# Patient Record
Sex: Female | Born: 1950 | Race: Black or African American | Hispanic: No | Marital: Married | Smoking: Current every day smoker
Health system: Southern US, Community
[De-identification: ages and names within clinical notes are randomized; demographics above are authoritative.]

## PROBLEM LIST (undated history)

## (undated) DIAGNOSIS — J449 Chronic obstructive pulmonary disease, unspecified: Secondary | ICD-10-CM

## (undated) DIAGNOSIS — Z789 Other specified health status: Secondary | ICD-10-CM

## (undated) HISTORY — DX: Other specified health status: Z78.9

## (undated) HISTORY — PX: OTHER SURGICAL HISTORY: SHX169

---

## 2004-08-09 ENCOUNTER — Ambulatory Visit: Payer: Self-pay

## 2005-05-24 ENCOUNTER — Emergency Department: Payer: Self-pay | Admitting: Emergency Medicine

## 2005-07-31 ENCOUNTER — Emergency Department: Payer: Self-pay | Admitting: Emergency Medicine

## 2005-09-05 ENCOUNTER — Ambulatory Visit: Payer: Self-pay

## 2006-01-13 ENCOUNTER — Emergency Department: Payer: Self-pay | Admitting: Emergency Medicine

## 2006-09-04 ENCOUNTER — Emergency Department: Payer: Self-pay

## 2007-02-05 ENCOUNTER — Ambulatory Visit: Payer: Self-pay

## 2008-03-12 ENCOUNTER — Emergency Department: Payer: Self-pay | Admitting: Emergency Medicine

## 2008-04-21 ENCOUNTER — Ambulatory Visit: Payer: Self-pay

## 2008-09-10 ENCOUNTER — Emergency Department: Payer: Self-pay | Admitting: Unknown Physician Specialty

## 2008-12-17 ENCOUNTER — Emergency Department: Payer: Self-pay | Admitting: Internal Medicine

## 2009-10-12 ENCOUNTER — Ambulatory Visit: Payer: Self-pay

## 2011-11-14 ENCOUNTER — Ambulatory Visit: Payer: Self-pay

## 2012-10-06 ENCOUNTER — Emergency Department: Payer: Self-pay | Admitting: Emergency Medicine

## 2012-10-06 LAB — CBC
MCH: 33 pg (ref 26.0–34.0)
MCV: 95 fL (ref 80–100)
Platelet: 182 10*3/uL (ref 150–440)
RBC: 3.72 10*6/uL — ABNORMAL LOW (ref 3.80–5.20)
RDW: 13 % (ref 11.5–14.5)
WBC: 9.4 10*3/uL (ref 3.6–11.0)

## 2012-10-06 LAB — COMPREHENSIVE METABOLIC PANEL
Albumin: 3.3 g/dL — ABNORMAL LOW (ref 3.4–5.0)
Anion Gap: 8 (ref 7–16)
BUN: 8 mg/dL (ref 7–18)
Bilirubin,Total: 0.7 mg/dL (ref 0.2–1.0)
Chloride: 101 mmol/L (ref 98–107)
Co2: 27 mmol/L (ref 21–32)
Creatinine: 0.75 mg/dL (ref 0.60–1.30)
EGFR (African American): >60
EGFR (Non-African Amer.): >60
Osmolality: 270 (ref 275–301)
Potassium: 3.1 mmol/L — ABNORMAL LOW (ref 3.5–5.1)
Sodium: 136 mmol/L (ref 136–145)
Total Protein: 7 g/dL (ref 6.4–8.2)

## 2012-10-06 LAB — LIPASE, BLOOD: Lipase: 89 U/L (ref 73–393)

## 2012-10-06 LAB — URINALYSIS, COMPLETE
Glucose,UR: NEGATIVE mg/dL (ref 0–75)
Nitrite: NEGATIVE
Protein: 100
Specific Gravity: 1.025 (ref 1.003–1.030)

## 2012-10-06 LAB — RAPID INFLUENZA A&B ANTIGENS

## 2012-11-19 ENCOUNTER — Ambulatory Visit: Payer: Self-pay

## 2013-02-16 ENCOUNTER — Emergency Department: Payer: Self-pay | Admitting: Emergency Medicine

## 2013-05-15 ENCOUNTER — Emergency Department: Payer: Self-pay | Admitting: Internal Medicine

## 2013-06-30 ENCOUNTER — Emergency Department: Payer: Self-pay | Admitting: Internal Medicine

## 2013-11-25 ENCOUNTER — Ambulatory Visit: Payer: Self-pay

## 2013-12-27 ENCOUNTER — Emergency Department: Payer: Self-pay | Admitting: Emergency Medicine

## 2014-02-10 ENCOUNTER — Emergency Department: Payer: Self-pay | Admitting: Emergency Medicine

## 2014-04-20 ENCOUNTER — Emergency Department: Payer: Self-pay | Admitting: Emergency Medicine

## 2015-05-27 ENCOUNTER — Emergency Department
Admission: EM | Admit: 2015-05-27 | Discharge: 2015-05-27 | Disposition: A | Discharge status: Home / Self Care | Payer: Self-pay | Attending: Emergency Medicine | Admitting: Emergency Medicine

## 2015-05-27 ENCOUNTER — Emergency Department: Payer: Self-pay

## 2015-05-27 DIAGNOSIS — S4991XA Unspecified injury of right shoulder and upper arm, initial encounter: Secondary | ICD-10-CM | POA: Insufficient documentation

## 2015-05-27 DIAGNOSIS — M25511 Pain in right shoulder: Secondary | ICD-10-CM

## 2015-05-27 DIAGNOSIS — W1830XA Fall on same level, unspecified, initial encounter: Secondary | ICD-10-CM | POA: Insufficient documentation

## 2015-05-27 DIAGNOSIS — Y9289 Other specified places as the place of occurrence of the external cause: Secondary | ICD-10-CM | POA: Insufficient documentation

## 2015-05-27 DIAGNOSIS — Y9389 Activity, other specified: Secondary | ICD-10-CM | POA: Insufficient documentation

## 2015-05-27 DIAGNOSIS — Z720 Tobacco use: Secondary | ICD-10-CM | POA: Insufficient documentation

## 2015-05-27 DIAGNOSIS — S161XXA Strain of muscle, fascia and tendon at neck level, initial encounter: Secondary | ICD-10-CM | POA: Insufficient documentation

## 2015-05-27 DIAGNOSIS — Y998 Other external cause status: Secondary | ICD-10-CM | POA: Insufficient documentation

## 2015-05-27 MED ORDER — IBUPROFEN 600 MG PO TABS: 600.0000 mg | ORAL_TABLET | Freq: Four times a day (QID) | ORAL | Status: DC | PRN

## 2015-05-27 MED ORDER — CYCLOBENZAPRINE HCL 5 MG PO TABS: 5.0000 mg | ORAL_TABLET | Freq: Three times a day (TID) | ORAL | Status: DC | PRN

## 2015-05-27 NOTE — ED Provider Notes (Signed)
[facility] Emergency Department Provider Note  ____________________________________________  Time seen: Approximately 11:01 AM  I have reviewed the triage vital signs and the nursing notes.   HISTORY  Chief Complaint Fall    HPI Kimberly Berger is a 64 y.o. female who presents with complaints of neck and shoulder pain. Patient states that she fell on Monday . Complains of just muscle aches and pains in her shoulder or neck. Concerned about arthritis.   History reviewed. No pertinent past medical history.  There are no active problems to display for this patient.   History reviewed. No pertinent past surgical history.  Current Outpatient Rx  Name  Route  Sig  Dispense  Refill  . cyclobenzaprine (FLEXERIL) 5 MG tablet   Oral   Take 1 tablet (5 mg total) by mouth every 8 (eight) hours as needed for muscle spasms.   30 tablet   0   . ibuprofen (ADVIL,MOTRIN) 600 MG tablet   Oral   Take 1 tablet (600 mg total) by mouth every 6 (six) hours as needed.   30 tablet   0     Allergies Review of patient's allergies indicates no known allergies.  No family history on file.  Social History Social History  Substance Use Topics  . Smoking status: Current Every Day Smoker    Types: Cigarettes  . Smokeless tobacco: None  . Alcohol Use: No    Review of Systems Constitutional: No fever/chills Eyes: No visual changes. ENT: No sore throat. Cardiovascular: Denies chest pain. Respiratory: Denies shortness of breath. Gastrointestinal: No abdominal pain.  No nausea, no vomiting.  No diarrhea.  No constipation. Genitourinary: Negative for dysuria. Musculoskeletal: Positive for neck shoulder and back pain.  Skin: Negative for rash. Neurological: Negative for headaches, focal weakness or numbness.  10-point ROS otherwise negative.  ____________________________________________   PHYSICAL EXAM:  VITAL SIGNS: ED Triage Vitals  Enc Vitals Group   BP 05/27/15 1021 151/68 mmHg     Pulse Rate 05/27/15 1021 78     Resp 05/27/15 1021 18     Temp 05/27/15 1021 98.2 F (36.8 C)     Temp Source 05/27/15 1021 Oral     SpO2 05/27/15 1021 98 %     Weight 05/27/15 1021 118 lb (53.524 kg)     Height 05/27/15 1021  (1.626 m)     Head Cir --      Peak Flow --      Pain Score 05/27/15 1022 8     Pain Loc --      Pain Edu? --      Excl. in GC? --     Constitutional: Alert and oriented. Well appearing and in no acute distress. Head: Atraumatic. Neck: No stridor.   Cardiovascular: Normal rate, regular rhythm. Grossly normal heart sounds.  Good peripheral circulation. Respiratory: Normal respiratory effort.  No retractions. Lungs CTAB. Musculoskeletal: No lower extremity tenderness nor edema.  No joint effusions. Full range of motion of the neck with point tenderness around C7. Neurologic:  Normal speech and language. No gross focal neurologic deficits are appreciated. No gait instability. Skin:  Skin is warm, dry and intact. No rash noted. Psychiatric: Mood and affect are normal. Speech and behavior are normal.  ____________________________________________   LABS (all labs ordered are listed, but only abnormal results are displayed)  Labs Reviewed - No data to display ____________________________________________  RADIOLOGY  Cervical and right shoulder x-rays interpreted by radiologist and reviewed by myself. Negative  for fracture dislocation. ____________________________________________   PROCEDURES  Procedure(s) performed: None  Critical Care performed: No  ____________________________________________   INITIAL IMPRESSION / ASSESSMENT AND PLAN / ED COURSE  Pertinent labs & imaging results that were available during my care of the patient were reviewed by me and considered in my medical decision making (see chart for details).  Acute cervical and shoulder strain. Rx given for Motrin 600 mg 3 times a day and Flexeril 5  mg 3 times a day. Patient follow-up with PCP as needed.  Patient voices no other emergency medical complaints at this time. ____________________________________________   FINAL CLINICAL IMPRESSION(S) / ED DIAGNOSES  Final diagnoses:  Neck strain, initial encounter  Shoulder pain, right      Evangeline Dakin, PA-C 05/27/15 1551  Sharyn Creamer, MD 05/27/15 1553

## 2015-05-27 NOTE — ED Notes (Signed)
Pt c/o right lateral rib pain and right shoulder pain, worse since fall on monday

## 2015-05-27 NOTE — Discharge Instructions (Signed)
Arthralgia °Your caregiver has diagnosed you as suffering from an arthralgia. Arthralgia means there is pain in a joint. This can come from many reasons including: °· Bruising the joint which causes soreness (inflammation) in the joint. °· Wear and tear on the joints which occur as we grow older (osteoarthritis). °· Overusing the joint. °· Various forms of arthritis. °· Infections of the joint. °Regardless of the cause of pain in your joint, most of these different pains respond to anti-inflammatory drugs and rest. The exception to this is when a joint is infected, and these cases are treated with antibiotics, if it is a bacterial infection. °HOME CARE INSTRUCTIONS  °· Rest the injured area for as long as directed by your caregiver. Then slowly start using the joint as directed by your caregiver and as the pain allows. Crutches as directed may be useful if the ankles, knees or hips are involved. If the knee was splinted or casted, continue use and care as directed. If an stretchy or elastic wrapping bandage has been applied today, it should be removed and re-applied every 3 to 4 hours. It should not be applied tightly, but firmly enough to keep swelling down. Watch toes and feet for swelling, bluish discoloration, coldness, numbness or excessive pain. If any of these problems (symptoms) occur, remove the ace bandage and re-apply more loosely. If these symptoms persist, contact your caregiver or return to this location. °· For the first 24 hours, keep the injured extremity elevated on pillows while lying down. °· Apply ice for 15-20 minutes to the sore joint every couple hours while awake for the first half day. Then 03-04 times per day for the first 48 hours. Put the ice in a plastic bag and place a towel between the bag of ice and your skin. °· Wear any splinting, casting, elastic bandage applications, or slings as instructed. °· Only take over-the-counter or prescription medicines for pain, discomfort, or fever as  directed by your caregiver. Do not use aspirin immediately after the injury unless instructed by your physician. Aspirin can cause increased bleeding and bruising of the tissues. °· If you were given crutches, continue to use them as instructed and do not resume weight bearing on the sore joint until instructed. °Persistent pain and inability to use the sore joint as directed for more than 2 to 3 days are warning signs indicating that you should see a caregiver for a follow-up visit as soon as possible. Initially, a hairline fracture (break in bone) may not be evident on X-rays. Persistent pain and swelling indicate that further evaluation, non-weight bearing or use of the joint (use of crutches or slings as instructed), or further X-rays are indicated. X-rays may sometimes not show a small fracture until a week or 10 days later. Make a follow-up appointment with your own caregiver or one to whom we have referred you. A radiologist (specialist in reading X-rays) may read your X-rays. Make sure you know how you are to obtain your X-ray results. Do not assume everything is normal if you do not hear from us. °SEEK MEDICAL CARE IF: °Bruising, swelling, or pain increases. °SEEK IMMEDIATE MEDICAL CARE IF:  °· Your fingers or toes are numb or blue. °· The pain is not responding to medications and continues to stay the same or get worse. °· The pain in your joint becomes severe. °· You develop a fever over 102° F (38.9° C). °· It becomes impossible to move or use the joint. °MAKE SURE YOU:  °·   Understand these instructions. °· Will watch your condition. °· Will get help right away if you are not doing well or get worse. °Document Released: 10/01/2005 Document Revised: 12/24/2011 Document Reviewed: 05/19/2008 °[employer]® Patient Information ©2015 [employer]. This information is not intended to replace advice given to you by your health care provider. Make sure you discuss any questions you have with your health care  provider. ° °Arthritis, Nonspecific °Arthritis is inflammation of a joint. This usually means pain, redness, warmth or swelling are present. One or more joints may be involved. There are a number of types of arthritis. Your caregiver may not be able to tell what type of arthritis you have right away. °CAUSES  °The most common cause of arthritis is the wear and tear on the joint (osteoarthritis). This causes damage to the cartilage, which can break down over time. The knees, hips, back and neck are most often affected by this type of arthritis. °Other types of arthritis and common causes of joint pain include: °· Sprains and other injuries near the joint. Sometimes minor sprains and injuries cause pain and swelling that develop hours later. °· Rheumatoid arthritis. This affects hands, feet and knees. It usually affects both sides of your body at the same time. It is often associated with chronic ailments, fever, weight loss and general weakness. °· Crystal arthritis. Gout and pseudo gout can cause occasional acute severe pain, redness and swelling in the foot, ankle, or knee. °· Infectious arthritis. Bacteria can get into a joint through a break in overlying skin. This can cause infection of the joint. Bacteria and viruses can also spread through the blood and affect your joints. °· Drug, infectious and allergy reactions. Sometimes joints can become mildly painful and slightly swollen with these types of illnesses. °SYMPTOMS  °· Pain is the main symptom. °· Your joint or joints can also be red, swollen and warm or hot to the touch. °· You may have a fever with certain types of arthritis, or even feel overall ill. °· The joint with arthritis will hurt with movement. Stiffness is present with some types of arthritis. °DIAGNOSIS  °Your caregiver will suspect arthritis based on your description of your symptoms and on your exam. Testing may be needed to find the type of arthritis: °· Blood and sometimes urine  tests. °· X-ray tests and sometimes CT or MRI scans. °· Removal of fluid from the joint (arthrocentesis) is done to check for bacteria, crystals or other causes. Your caregiver (or a specialist) will numb the area over the joint with a local anesthetic, and use a needle to remove joint fluid for examination. This procedure is only minimally uncomfortable. °· Even with these tests, your caregiver may not be able to tell what kind of arthritis you have. Consultation with a specialist (rheumatologist) may be helpful. °TREATMENT  °Your caregiver will discuss with you treatment specific to your type of arthritis. If the specific type cannot be determined, then the following general recommendations may apply. °Treatment of severe joint pain includes: °· Rest. °· Elevation. °· Anti-inflammatory medication (for example, ibuprofen) may be prescribed. Avoiding activities that cause increased pain. °· Only take over-the-counter or prescription medicines for pain and discomfort as recommended by your caregiver. °· Cold packs over an inflamed joint may be used for 10 to 15 minutes every hour. Hot packs sometimes feel better, but do not use overnight. Do not use hot packs if you are diabetic without your caregiver's permission. °· A cortisone shot into arthritic   joints may help reduce pain and swelling. °· Any acute arthritis that gets worse over the next 1 to 2 days needs to be looked at to be sure there is no joint infection. °Long-term arthritis treatment involves modifying activities and lifestyle to reduce joint stress jarring. This can include weight loss. Also, exercise is needed to nourish the joint cartilage and remove waste. This helps keep the muscles around the joint strong. °HOME CARE INSTRUCTIONS  °· Do not take aspirin to relieve pain if gout is suspected. This elevates uric acid levels. °· Only take over-the-counter or prescription medicines for pain, discomfort or fever as directed by your caregiver. °· Rest the  joint as much as possible. °· If your joint is swollen, keep it elevated. °· Use crutches if the painful joint is in your leg. °· Drinking plenty of fluids may help for certain types of arthritis. °· Follow your caregiver's dietary instructions. °· Try low-impact exercise such as: °¨ Swimming. °¨ Water aerobics. °¨ Biking. °¨ Walking. °· Morning stiffness is often relieved by a warm shower. °· Put your joints through regular range-of-motion. °SEEK MEDICAL CARE IF:  °· You do not feel better in 24 hours or are getting worse. °· You have side effects to medications, or are not getting better with treatment. °SEEK IMMEDIATE MEDICAL CARE IF:  °· You have a fever. °· You develop severe joint pain, swelling or redness. °· Many joints are involved and become painful and swollen. °· There is severe back pain and/or leg weakness. °· You have loss of bowel or bladder control. °Document Released: 11/08/2004 Document Revised: 12/24/2011 Document Reviewed: 11/24/2008 °[employer]® Patient Information ©2015 [employer]. This information is not intended to replace advice given to you by your health care provider. Make sure you discuss any questions you have with your health care provider. ° °

## 2015-06-28 ENCOUNTER — Emergency Department
Admission: EM | Admit: 2015-06-28 | Discharge: 2015-06-28 | Disposition: A | Discharge status: Home / Self Care | Payer: Self-pay | Attending: Emergency Medicine | Admitting: Emergency Medicine

## 2015-06-28 ENCOUNTER — Emergency Department: Payer: Self-pay

## 2015-06-28 ENCOUNTER — Encounter: Payer: Self-pay | Admitting: *Deleted

## 2015-06-28 DIAGNOSIS — T148 Other injury of unspecified body region: Secondary | ICD-10-CM | POA: Insufficient documentation

## 2015-06-28 DIAGNOSIS — Y9389 Activity, other specified: Secondary | ICD-10-CM | POA: Insufficient documentation

## 2015-06-28 DIAGNOSIS — Z720 Tobacco use: Secondary | ICD-10-CM | POA: Insufficient documentation

## 2015-06-28 DIAGNOSIS — S79911A Unspecified injury of right hip, initial encounter: Secondary | ICD-10-CM | POA: Insufficient documentation

## 2015-06-28 DIAGNOSIS — Y9289 Other specified places as the place of occurrence of the external cause: Secondary | ICD-10-CM | POA: Insufficient documentation

## 2015-06-28 DIAGNOSIS — M25551 Pain in right hip: Secondary | ICD-10-CM

## 2015-06-28 DIAGNOSIS — W19XXXA Unspecified fall, initial encounter: Secondary | ICD-10-CM

## 2015-06-28 DIAGNOSIS — S3991XA Unspecified injury of abdomen, initial encounter: Secondary | ICD-10-CM | POA: Insufficient documentation

## 2015-06-28 DIAGNOSIS — W1839XA Other fall on same level, initial encounter: Secondary | ICD-10-CM | POA: Insufficient documentation

## 2015-06-28 DIAGNOSIS — Y998 Other external cause status: Secondary | ICD-10-CM | POA: Insufficient documentation

## 2015-06-28 MED ORDER — OXYCODONE-ACETAMINOPHEN 5-325 MG PO TABS: 1.0000 | ORAL_TABLET | Freq: Four times a day (QID) | ORAL | Status: DC | PRN

## 2015-06-28 NOTE — ED Notes (Signed)
Pt ambulatory to triage room with steady gait. Pt reports last Wednesday she was mowing her grass and went over a statue in her yard, causing her to fall back. C/o right hip pain, has been taking ibuprofen with some relief.

## 2015-06-28 NOTE — ED Provider Notes (Signed)
[facility] Emergency Department Provider Note     Time seen: ----------------------------------------- 5:35 PM on 06/28/2015 -----------------------------------------    I have reviewed the triage vital signs and the nursing notes.   HISTORY  Chief Complaint Fall    HPI Kimberly Berger is a 64 y.o. female who presents to ER with right hip pain. Patient states last Wednesday she was mowing her grass and went over a statue in her yard. This caused her to fall and injure her right hip. She's been taking ibuprofen with some relief, denies any other complaints.   History reviewed. No pertinent past medical history.  There are no active problems to display for this patient.   History reviewed. No pertinent past surgical history.  Allergies Review of patient's allergies indicates no known allergies.  Social History Social History  Substance Use Topics  . Smoking status: Current Every Day Smoker    Types: Cigarettes  . Smokeless tobacco: None  . Alcohol Use: No    Review of Systems Constitutional: Negative for fever. Gastrointestinal: Positive for abdominal pain Genitourinary: Negative for dysuria. Musculoskeletal: Positive for right hip pain Skin: Negative for rash. ____________________________________________   PHYSICAL EXAM:  VITAL SIGNS: ED Triage Vitals  Enc Vitals Group     BP 06/28/15 1708 132/74 mmHg     Pulse Rate 06/28/15 1708 77     Resp 06/28/15 1708 18     Temp 06/28/15 1708 98.4 F (36.9 C)     Temp Source 06/28/15 1708 Oral     SpO2 06/28/15 1708 98 %     Weight 06/28/15 1708 118 lb (53.524 kg)     Height --      Head Cir --      Peak Flow --      Pain Score 06/28/15 1708 5     Pain Loc --      Pain Edu? --      Excl. in GC? --     Constitutional: Alert and oriented. Well appearing and in no distress. Gastrointestinal: Mild epigastric tenderness Musculoskeletal: Pain with range of motion right hip Neurologic:   Normal speech and language. No gross focal neurologic deficits are appreciated. Speech is normal. No gait instability. Skin:  Skin is warm, dry and intact. No rash noted.  ____________________________________________  ED COURSE:  Pertinent labs & imaging results that were available during my care of the patient were reviewed by me and considered in my medical decision making (see chart for details). Patient is no acute distress, will obtain right hip x-rays ____________________________________________  RADIOLOGY Images were viewed by me  Right hip x-rays  IMPRESSION: Negative.Consider MRI of right hip if there is reasonable consideration of radiographically occult fracture. ____________________________________________  FINAL ASSESSMENT AND PLAN  Fall, contusion  Plan: Patient with labs and imaging as dictated above. Patient is stable for discharge with pain medication and close follow-up with her doctor.   Emily Filbert, MD   Emily Filbert, MD 06/28/15 [phone]

## 2015-06-28 NOTE — Discharge Instructions (Signed)
Fall Prevention and Home Safety Falls cause injuries and can affect all age groups. It is possible to prevent falls.  HOW TO PREVENT FALLS  Wear shoes with rubber soles that do not have an opening for your toes.  Keep the inside and outside of your house well lit.  Use night lights throughout your home.  Remove clutter from floors.  Clean up floor spills.  Remove throw rugs or fasten them to the floor with carpet tape.  Do not place electrical cords across pathways.  Put grab bars by your tub, shower, and toilet. Do not use towel bars as grab bars.  Put handrails on both sides of the stairway. Fix loose handrails.  Do not climb on stools or stepladders, if possible.  Do not wax your floors.  Repair uneven or unsafe sidewalks, walkways, or stairs.  Keep items you use a lot within reach.  Be aware of pets.  Keep emergency numbers next to the telephone.  Put smoke detectors in your home and near bedrooms. Ask your doctor what other things you can do to prevent falls. Document Released: 07/28/2009 Document Revised: 04/01/2012 Document Reviewed: 01/01/2012 [facility] Patient Information 2015 [place], [place]. This information is not intended to replace advice given to you by your health care provider. Make sure you discuss any questions you have with your health care provider.  Hip Pain Your hip is the joint between your upper legs and your lower pelvis. The bones, cartilage, tendons, and muscles of your hip joint perform a lot of work each day supporting your body weight and allowing you to move around. Hip pain can range from a minor ache to severe pain in one or both of your hips. Pain may be felt on the inside of the hip joint near the groin, or the outside near the buttocks and upper thigh. You may have swelling or stiffness as well.  HOME CARE INSTRUCTIONS   Take medicines only as directed by your health care provider.  Apply ice to the injured area:  Put ice in a  plastic bag.  Place a towel between your skin and the bag.  Leave the ice on for 15-20 minutes at a time, 3-4 times a day.  Keep your leg raised (elevated) when possible to lessen swelling.  Avoid activities that cause pain.  Follow specific exercises as directed by your health care provider.  Sleep with a pillow between your legs on your most comfortable side.  Record how often you have hip pain, the location of the pain, and what it feels like. SEEK MEDICAL CARE IF:   You are unable to put weight on your leg.  Your hip is red or swollen or very tender to touch.  Your pain or swelling continues or worsens after 1 week.  You have increasing difficulty walking.  You have a fever. SEEK IMMEDIATE MEDICAL CARE IF:   You have fallen.  You have a sudden increase in pain and swelling in your hip. MAKE SURE YOU:   Understand these instructions.  Will watch your condition.  Will get help right away if you are not doing well or get worse. Document Released: 03/21/2010 Document Revised: 02/15/2014 Document Reviewed: 05/28/2013 [facility] Patient Information 2015 [place], [place]. This information is not intended to replace advice given to you by your health care provider. Make sure you discuss any questions you have with your health care provider.

## 2015-08-04 ENCOUNTER — Emergency Department: Payer: Self-pay

## 2015-08-04 ENCOUNTER — Encounter: Payer: Self-pay | Admitting: Emergency Medicine

## 2015-08-04 ENCOUNTER — Emergency Department
Admission: EM | Admit: 2015-08-04 | Discharge: 2015-08-04 | Disposition: A | Discharge status: Home / Self Care | Payer: Self-pay | Attending: Emergency Medicine | Admitting: Emergency Medicine

## 2015-08-04 DIAGNOSIS — M545 Low back pain: Secondary | ICD-10-CM | POA: Insufficient documentation

## 2015-08-04 DIAGNOSIS — Z720 Tobacco use: Secondary | ICD-10-CM | POA: Insufficient documentation

## 2015-08-04 DIAGNOSIS — J189 Pneumonia, unspecified organism: Secondary | ICD-10-CM

## 2015-08-04 DIAGNOSIS — J159 Unspecified bacterial pneumonia: Secondary | ICD-10-CM | POA: Insufficient documentation

## 2015-08-04 DIAGNOSIS — M542 Cervicalgia: Secondary | ICD-10-CM | POA: Insufficient documentation

## 2015-08-04 DIAGNOSIS — M7918 Myalgia, other site: Secondary | ICD-10-CM

## 2015-08-04 DIAGNOSIS — R42 Dizziness and giddiness: Secondary | ICD-10-CM | POA: Insufficient documentation

## 2015-08-04 MED ORDER — ALBUTEROL SULFATE HFA 108 (90 BASE) MCG/ACT IN AERS
2.0000 | INHALATION_SPRAY | Freq: Once | RESPIRATORY_TRACT | Status: AC
  Administered 2015-08-04: 2 via RESPIRATORY_TRACT
  Filled 2015-08-04: qty 6.7

## 2015-08-04 MED ORDER — AZITHROMYCIN 250 MG PO TABS: 500.0000 mg | ORAL_TABLET | Freq: Every day | ORAL | Status: AC

## 2015-08-04 MED ORDER — PREDNISONE 20 MG PO TABS: 60.0000 mg | ORAL_TABLET | Freq: Once | ORAL | Status: DC

## 2015-08-04 MED ORDER — IBUPROFEN 600 MG PO TABS: 600.0000 mg | ORAL_TABLET | Freq: Three times a day (TID) | ORAL | Status: DC | PRN

## 2015-08-04 MED ORDER — ALBUTEROL SULFATE HFA 108 (90 BASE) MCG/ACT IN AERS
2.0000 | INHALATION_SPRAY | RESPIRATORY_TRACT | Status: DC | PRN
Start: 2015-08-04 — End: 2015-12-01

## 2015-08-04 MED ORDER — IBUPROFEN 600 MG PO TABS
600.0000 mg | ORAL_TABLET | Freq: Once | ORAL | Status: AC
  Administered 2015-08-04: 600 mg via ORAL
  Filled 2015-08-04: qty 1

## 2015-08-04 MED ORDER — ALBUTEROL SULFATE HFA 108 (90 BASE) MCG/ACT IN AERS: 2.0000 | INHALATION_SPRAY | Freq: Once | RESPIRATORY_TRACT | Status: DC

## 2015-08-04 MED ORDER — AZITHROMYCIN 250 MG PO TABS
500.0000 mg | ORAL_TABLET | Freq: Once | ORAL | Status: AC
  Administered 2015-08-04: 500 mg via ORAL
  Filled 2015-08-04: qty 2

## 2015-08-04 MED ORDER — PREDNISONE 20 MG PO TABS
60.0000 mg | ORAL_TABLET | Freq: Once | ORAL | Status: AC
  Administered 2015-08-04: 60 mg via ORAL
  Filled 2015-08-04: qty 3

## 2015-08-04 MED ORDER — OPTICHAMBER DIAMOND MISC
1.0000 | Freq: Once | Status: AC
  Administered 2015-08-04: 1
  Filled 2015-08-04: qty 1

## 2015-08-04 NOTE — ED Provider Notes (Signed)
[facility] Emergency Department Provider Note  ____________________________________________  Time seen: Approximately 12:26 PM  I have reviewed the triage vital signs and the nursing notes.   HISTORY  Chief Complaint Generalized Body Aches and Cough    HPI Kimberly Berger is a 64 y.o. female with ongoing tobacco abuse presenting with 3 days of cough productive of green sputum. She also reports subjective fevers and chills. She has nausea only at night. No chest pain, shortness of breath, dyspnea on exertion. No lower extremity swelling. She is also reporting of "crick" in her left neck and soreness over her right buttock. No history of trauma. No numbness, weakness, or tingling.   History reviewed. No pertinent past medical history.  There are no active problems to display for this patient.   History reviewed. No pertinent past surgical history.  Current Outpatient Rx  Name  Route  Sig  Dispense  Refill  . albuterol (PROVENTIL HFA;VENTOLIN HFA) 108 (90 BASE) MCG/ACT inhaler   Inhalation   Inhale 2 puffs into the lungs every 4 (four) hours as needed for wheezing or shortness of breath.   1 Inhaler   0   . azithromycin (ZITHROMAX) 250 MG tablet   Oral   Take 2 tablets (500 mg total) by mouth daily. Take 1 tablet daily for 3 days.   4 tablet   0   . cyclobenzaprine (FLEXERIL) 5 MG tablet   Oral   Take 1 tablet (5 mg total) by mouth every 8 (eight) hours as needed for muscle spasms.   30 tablet   0   . ibuprofen (ADVIL,MOTRIN) 600 MG tablet   Oral   Take 1 tablet (600 mg total) by mouth every 8 (eight) hours as needed for moderate pain (with food).   15 tablet   0   . oxyCODONE-acetaminophen (ROXICET) 5-325 MG per tablet   Oral   Take 1 tablet by mouth every 6 (six) hours as needed.   20 tablet   0   . predniSONE (DELTASONE) 20 MG tablet   Oral   Take 3 tablets (60 mg total) by mouth once.   15 tablet   0     Allergies Review of  patient's allergies indicates no known allergies.  History reviewed. No pertinent family history.  Social History Social History  Substance Use Topics  . Smoking status: Current Every Day Smoker    Types: Cigarettes  . Smokeless tobacco: None  . Alcohol Use: No    Review of Systems  Constitutional: Subjective fever/chills area and no syncope. Lightheadedness Eyes: No visual changes.  ENT: No sore throat. Cardiovascular: Denies chest pain, palpitations. Respiratory: Denies shortness of breath.  As needed for productive cough. Gastrointestinal: No abdominal pain.  Positive nausea, no vomiting.  No diarrhea.  No constipation. Genitourinary: Negative for dysuria. Musculoskeletal: Negative for back pain. Skin: Negative for rash. Neurological: Negative for headaches, focal weakness or numbness. 10-point ROS otherwise negative.  ____________________________________________   PHYSICAL EXAM:  VITAL SIGNS: ED Triage Vitals  Enc Vitals Group     BP 08/04/15 1107 104/74 mmHg     Pulse Rate 08/04/15 1107 97     Resp 08/04/15 1107 20     Temp 08/04/15 1107 98.2 F (36.8 C)     Temp Source 08/04/15 1107 Oral     SpO2 08/04/15 1107 97 %     Weight 08/04/15 1107 118 lb (53.524 kg)     Height 08/04/15 1107 5\' 4"  (1.626 m)  Head Cir --      Peak Flow --      Pain Score 08/04/15 1108 6     Pain Loc --      Pain Edu? --      Excl. in GC? --     Constitutional: Alert and oriented. Well appearing and in no acute distress. Answers question appropriately. Eyes: Conjunctivae are normal.  EOMI. no scleral icterus. Head: Atraumatic. Nose: No congestion/rhinnorhea. Mouth/Throat: Mucous membranes are moist. No posterior pharyngeal erythema, no tonsillar swelling or exudate. Palate is symmetric and uvula is midline. Neck: No stridor.  Supple.  Full range of motion with minimal pain when she looks laterally left. No midline C-spine tenderness, no step-offs or  deformities. Cardiovascular: Normal rate, regular rhythm. No murmurs, rubs or gallops.  Respiratory: Normal respiratory effort.  No retractions. Lungs CTAB.  No wheezes, ronchi. Minimal Rales in the left base which clear with deep cough.. Gastrointestinal: Soft and nontender. No distention. No peritoneal signs. PELVIS: Pelvis is stable with full range of motion of the bilateral knees and hips without pain.  Musculoskeletal: No LE edema. No midline lumbar spine tenderness to palpation. Neurologic:  Normal speech and language. 5 out of 5 bilateral grip biceps triceps hip flexors, dorsiflexion and plantarflexion. Normal sensation to light touch throughout the bilateral upper extremities and lower extremities. Gait is normal without ataxia. Skin:  Skin is warm, dry and intact. No rash noted. Psychiatric: Mood and affect are normal. Speech and behavior are normal.  Normal judgement.  ____________________________________________   LABS (all labs ordered are listed, but only abnormal results are displayed)  Labs Reviewed - No data to display ____________________________________________  EKG  ED ECG REPORT I, Rockne Menghini, the attending physician, personally viewed and interpreted this ECG.   Date: 08/04/2015  EKG Time: 1255  Rate: 83  Rhythm: normal sinus rhythm  Axis: Normal  Intervals:none  ST&T Change: No ST elevation  ____________________________________________  RADIOLOGY  Dg Chest 2 View  08/04/2015  CLINICAL DATA:  Cough.  Chills.  Back pain. EXAM: CHEST  2 VIEW COMPARISON:  12/27/2013 FINDINGS: Mild hyperinflation. Midline trachea. Normal heart size and mediastinal contours. No pleural effusion or pneumothorax. Lower lobe predominant interstitial thickening is moderate and similar. Somewhat more confluent opacity in the left lower lobe is slightly increased. IMPRESSION: Pattern of hyperinflation and interstitial thickening again suggests smoking/chronic bronchitis. More  focal left base opacity. Although there is scarring in this area on the prior exam, developing pneumonia is suspected. If symptoms persist or worsen, consider short term radiographic follow-up. Electronically Signed   By: Jeronimo Greaves M.D.   On: 08/04/2015 12:55    ____________________________________________   PROCEDURES  Procedure(s) performed: None  Critical Care performed: No ____________________________________________   INITIAL IMPRESSION / ASSESSMENT AND PLAN / ED COURSE  Pertinent labs & imaging results that were available during my care of the patient were reviewed by me and considered in my medical decision making (see chart for details).  64 y.o. female with ongoing tobacco use presenting with 3 days of cough productive of green sputum with subjective fevers and chills. She does not have shortness of breath or any evidence of respiratory distress to today. She has no chest pain and this is like unlikely to be primarily cardiac in origin. She does have some musculoskeletal pain in the right lateral neck and over the right buttock, but I do not see any evidence of skin disorders, bony injury or trauma, or infection. There is no neurologic  deficit associated with these. I will plan to treat her symptomatically, get a chest x-ray to rule out pneumonia, provide her with an albuterol MDI and steroid course. I anticipate she will be discharged home with follow-up with [facility] clinic which is her PMD office.  ____________________________________________  FINAL CLINICAL IMPRESSION(S) / ED DIAGNOSES  Final diagnoses:  Neck pain on left side  Right buttock pain  Community acquired pneumonia      NEW MEDICATIONS STARTED DURING THIS VISIT:  Discharge Medication List as of 08/04/2015  1:19 PM    START taking these medications   Details  albuterol (PROVENTIL HFA;VENTOLIN HFA) 108 (90 BASE) MCG/ACT inhaler Inhale 2 puffs into the lungs every 4 (four) hours as needed for wheezing or  shortness of breath., Starting 08/04/2015, Until Discontinued, Print    azithromycin (ZITHROMAX) 250 MG tablet Take 2 tablets (500 mg total) by mouth daily. Take 1 tablet daily for 3 days., Starting 08/04/2015, Until Sun 08/07/15, Print    predniSONE (DELTASONE) 20 MG tablet Take 3 tablets (60 mg total) by mouth once., Starting 08/04/2015, Print         Rockne Menghini, MD 08/04/15 1349

## 2015-08-04 NOTE — ED Notes (Signed)
Pt to ed with c/o cough, congestion and body aches x 3 days.

## 2015-08-04 NOTE — Discharge Instructions (Signed)
Please make an appointment to follow up with her primary care physician in one week.   Please take the entire course of antibiotics for your pneumonia, even if you're feeling better. You may take albuterol for cough and wheezing. Please take the entire course of steroids as well.  For your neck pain and right buttock pain please apply a heating pad or an ice pack, which ever feels better, for 20 minutes every 2-3 hours. You may also take Motrin and Tylenol for your pain.  Community-Acquired Pneumonia, Adult Pneumonia is an infection of the lungs. One type of pneumonia can happen while a person is in a hospital. A different type can happen when a person is not in a hospital (community-acquired pneumonia). It is easy for this kind to spread from person to person. It can spread to you if you breathe near an infected person who coughs or sneezes. Some symptoms include:  A dry cough.  A wet (productive) cough.  Fever.  Sweating.  Chest pain. HOME CARE  Take over-the-counter and prescription medicines only as told by your doctor.  Only take cough medicine if you are losing sleep.  If you were prescribed an antibiotic medicine, take it as told by your doctor. Do not stop taking the antibiotic even if you start to feel better.  Sleep with your head and neck raised (elevated). You can do this by putting a few pillows under your head, or you can sleep in a recliner.  Do not use tobacco products. These include cigarettes, chewing tobacco, and e-cigarettes. If you need help quitting, ask your doctor.  Drink enough water to keep your pee (urine) clear or pale yellow. A shot (vaccine) can help prevent pneumonia. Shots are often suggested for:  People older than 62 years of age.  People older than 62 years of age:  Who are having cancer treatment.  Who have long-term (chronic) lung disease.  Who have problems with their body's defense system (immune system). You may also prevent pneumonia  if you take these actions:  Get the flu (influenza) shot every year.  Go to the dentist as often as told.  Wash your hands often. If soap and water are not available, use hand sanitizer. GET HELP IF:  You have a fever.  You lose sleep because your cough medicine does not help. GET HELP RIGHT AWAY IF:  You are short of breath and it gets worse.  You have more chest pain.  Your sickness gets worse. This is very serious if:  You are an older adult.  Your body's defense system is weak.  You cough up blood.   This information is not intended to replace advice given to you by your health care provider. Make sure you discuss any questions you have with your health care provider.   Document Released: 03/19/2008 Document Revised: 06/22/2015 Document Reviewed: 01/26/2015 Elsevier Interactive Patient Education [employer]2016 Elsevier Inc.

## 2015-08-04 NOTE — ED Notes (Signed)
Pt presents with cough, cold chills, back pain and neck pain started about three days ago. Pt states has a productive cough, sputum is green in color. No cough noted, no acute distress noted. Pt with clear lung fields.

## 2015-08-04 NOTE — ED Notes (Signed)
Patient educated on use on opti-chamber use.  Patient able to repeat information and demonstrate use.

## 2015-11-14 ENCOUNTER — Encounter: Payer: Self-pay | Admitting: Emergency Medicine

## 2015-11-14 ENCOUNTER — Emergency Department
Admission: EM | Admit: 2015-11-14 | Discharge: 2015-11-14 | Disposition: A | Discharge status: Home / Self Care | Payer: Self-pay | Attending: Emergency Medicine | Admitting: Emergency Medicine

## 2015-11-14 DIAGNOSIS — F17210 Nicotine dependence, cigarettes, uncomplicated: Secondary | ICD-10-CM | POA: Insufficient documentation

## 2015-11-14 DIAGNOSIS — Z7952 Long term (current) use of systemic steroids: Secondary | ICD-10-CM | POA: Insufficient documentation

## 2015-11-14 DIAGNOSIS — N814 Uterovaginal prolapse, unspecified: Secondary | ICD-10-CM | POA: Insufficient documentation

## 2015-11-14 DIAGNOSIS — F419 Anxiety disorder, unspecified: Secondary | ICD-10-CM | POA: Insufficient documentation

## 2015-11-14 LAB — URINALYSIS COMPLETE WITH MICROSCOPIC (ARMC ONLY)
BACTERIA UA: NONE SEEN
Bilirubin Urine: NEGATIVE
GLUCOSE, UA: NEGATIVE mg/dL
Hgb urine dipstick: NEGATIVE
Ketones, ur: NEGATIVE mg/dL
Leukocytes, UA: NEGATIVE
Nitrite: NEGATIVE
PROTEIN: NEGATIVE mg/dL
Specific Gravity, Urine: 1.021 (ref 1.005–1.030)
pH: 7 (ref 5.0–8.0)

## 2015-11-14 LAB — COMPREHENSIVE METABOLIC PANEL
ALBUMIN: 3.8 g/dL (ref 3.5–5.0)
ALT: 11 U/L — ABNORMAL LOW (ref 14–54)
ANION GAP: 5 (ref 5–15)
AST: 19 U/L (ref 15–41)
Alkaline Phosphatase: 80 U/L (ref 38–126)
BILIRUBIN TOTAL: 0.5 mg/dL (ref 0.3–1.2)
BUN: 16 mg/dL (ref 6–20)
CHLORIDE: 107 mmol/L (ref 101–111)
CO2: 29 mmol/L (ref 22–32)
Calcium: 8.7 mg/dL — ABNORMAL LOW (ref 8.9–10.3)
Creatinine, Ser: 0.85 mg/dL (ref 0.44–1.00)
GFR calc Af Amer: >60 mL/min (ref >60)
Glucose, Bld: 83 mg/dL (ref 65–99)
POTASSIUM: 3.8 mmol/L (ref 3.5–5.1)
Sodium: 141 mmol/L (ref 135–145)
TOTAL PROTEIN: 6.7 g/dL (ref 6.5–8.1)

## 2015-11-14 LAB — CBC
HEMATOCRIT: 37.8 % (ref 35.0–47.0)
HEMOGLOBIN: 12.7 g/dL (ref 12.0–16.0)
MCH: 32.8 pg (ref 26.0–34.0)
MCHC: 33.6 g/dL (ref 32.0–36.0)
MCV: 97.6 fL (ref 80.0–100.0)
Platelets: 224 10*3/uL (ref 150–440)
RBC: 3.87 MIL/uL (ref 3.80–5.20)
RDW: 13.4 % (ref 11.5–14.5)
WBC: 6.2 10*3/uL (ref 3.6–11.0)

## 2015-11-14 LAB — LIPASE, BLOOD: LIPASE: 25 U/L (ref 11–51)

## 2015-11-14 MED ORDER — TRAMADOL HCL 50 MG PO TABS: 50.0000 mg | ORAL_TABLET | Freq: Four times a day (QID) | ORAL | Status: DC | PRN

## 2015-11-14 NOTE — Discharge Instructions (Signed)

## 2015-11-14 NOTE — ED Notes (Signed)
Says her back and abd are hurting. Says seh was told that she has prolapsed uterus, but has not followed up on that.

## 2015-11-14 NOTE — ED Provider Notes (Signed)
[facility] Emergency Department Provider Note  ____________________________________________    I have reviewed the triage vital signs and the nursing notes.   HISTORY  Chief Complaint Abdominal Pain    HPI Kimberly Berger is a 65 y.o. female who presents with complaints of lower pelvic discomfort which has been bothering her for "at least a couple of months". She was apparently told that she has uterine prolapse. She denies nausea or vomiting. She denies fevers or chills. She denies vaginal discharge or bleeding. 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 Her back pain is bilateral and worse with heavy lifting. No focal deficits.     History reviewed. No pertinent past medical history.  There are no active problems to display for this patient.   No past surgical history on file.  Current Outpatient Rx  Name  Route  Sig  Dispense  Refill  . albuterol (PROVENTIL HFA;VENTOLIN HFA) 108 (90 BASE) MCG/ACT inhaler   Inhalation   Inhale 2 puffs into the lungs every 4 (four) hours as needed for wheezing or shortness of breath.   1 Inhaler   0   . cyclobenzaprine (FLEXERIL) 5 MG tablet   Oral   Take 1 tablet (5 mg total) by mouth every 8 (eight) hours as needed for muscle spasms.   30 tablet   0   . ibuprofen (ADVIL,MOTRIN) 600 MG tablet   Oral   Take 1 tablet (600 mg total) by mouth every 8 (eight) hours as needed for moderate pain (with food).   15 tablet   0   . oxyCODONE-acetaminophen (ROXICET) 5-325 MG per tablet   Oral   Take 1 tablet by mouth every 6 (six) hours as needed.   20 tablet   0   . predniSONE (DELTASONE) 20 MG tablet   Oral   Take 3 tablets (60 mg total) by mouth once.   15 tablet   0   . traMADol (ULTRAM) 50 MG tablet   Oral   Take 1 tablet (50 mg total) by mouth every 6 (six) hours as needed.   20 tablet   0     Allergies Review of patient's allergies indicates no known  allergies.  No family history on file.  Social History Social History  Substance Use Topics  . Smoking status: Current Every Day Smoker    Types: Cigarettes  . Smokeless tobacco: None  . Alcohol Use: No    Review of Systems  Constitutional: Negative for fever. Eyes: Negative for visual changes. ENT: Negative for sore throat Cardiovascular: Negative for chest pain. Respiratory: Negative for shortness of breath. Gastrointestinal: As above Genitourinary: Negative for dysuria. Musculoskeletal: As above Skin: Negative for rash. Neurological: Negative for headaches or focal weakness Psychiatric: Mild anxiety    ____________________________________________   PHYSICAL EXAM:  VITAL SIGNS: ED Triage Vitals  Enc Vitals Group     BP 11/14/15 1756 143/71 mmHg     Pulse Rate 11/14/15 1756 81     Resp 11/14/15 1756 17     Temp 11/14/15 1756 97.8 F (36.6 C)     Temp Source 11/14/15 1756 Oral     SpO2 11/14/15 1756 98 %     Weight 11/14/15 1756 110 lb (49.896 kg)     Height 11/14/15 1756  (1.626 m)     Head Cir --      Peak Flow --      Pain Score 11/14/15 1758 7  Pain Loc --      Pain Edu? --      Excl. in GC? --      Constitutional: Alert and oriented. Well appearing and in no distress. Eyes: Conjunctivae are normal.  ENT   Head: Normocephalic and atraumatic.   Mouth/Throat: Mucous membranes are moist. Cardiovascular: Normal rate, regular rhythm. Normal and symmetric distal pulses are present in all extremities. No murmurs, rubs, or gallops. Respiratory: Normal respiratory effort without tachypnea nor retractions. Breath sounds are clear and equal bilaterally.  Gastrointestinal: Soft and non-tender in all quadrants. No distention. There is no CVA tenderness. Genitourinary: Patient's cervix is visible chest within the vaginal introitus Musculoskeletal: Nontender with normal range of motion in all extremities. No lower extremity tenderness nor edema. No  vertebral tenderness to palpation. Neurologic:  Normal speech and language. No gross focal neurologic deficits are appreciated. Skin:  Skin is warm, dry and intact. No rash noted. Psychiatric: Mood and affect are normal. Patient exhibits appropriate insight and judgment.  ____________________________________________    LABS (pertinent positives/negatives)  Labs Reviewed  COMPREHENSIVE METABOLIC PANEL - Abnormal; Notable for the following:    Calcium 8.7 (*)    ALT 11 (*)    All other components within normal limits  URINALYSIS COMPLETEWITH MICROSCOPIC ([facility] ONLY) - Abnormal; Notable for the following:    Color, Urine YELLOW (*)    APPearance CLEAR (*)    Squamous Epithelial / LPF 0-5 (*)    All other components within normal limits  LIPASE, BLOOD  CBC    ____________________________________________   EKG  None  ____________________________________________    RADIOLOGY I have personally reviewed any xrays that were ordered on this patient: None  ____________________________________________   PROCEDURES  Procedure(s) performed: none  Critical Care performed: none  ____________________________________________   INITIAL IMPRESSION / ASSESSMENT AND PLAN / ED COURSE  Pertinent labs & imaging results that were available during my care of the patient were reviewed by me and considered in my medical decision making (see chart for details).  Patient well-appearing, ambulating and moving without any discomfort. She has been told that she has a prolapsed uterus and in fact this is confirmed on exam. This may very well be causing her pelvic discomfort. I have referred her to gynecology for further evaluation  ____________________________________________   FINAL CLINICAL IMPRESSION(S) / ED DIAGNOSES  Final diagnoses:  Uterine prolapse     Jene Every, MD 11/14/15 2159

## 2015-12-01 ENCOUNTER — Encounter: Payer: Self-pay | Admitting: Obstetrics and Gynecology

## 2015-12-01 ENCOUNTER — Ambulatory Visit (INDEPENDENT_AMBULATORY_CARE_PROVIDER_SITE_OTHER): Payer: Self-pay | Admitting: Obstetrics and Gynecology

## 2015-12-01 VITALS — BP 148/67 | HR 75 | Ht 62.0 in | Wt 111.8 lb

## 2015-12-01 DIAGNOSIS — N8110 Cystocele, unspecified: Secondary | ICD-10-CM

## 2015-12-01 DIAGNOSIS — N814 Uterovaginal prolapse, unspecified: Secondary | ICD-10-CM

## 2015-12-01 DIAGNOSIS — N816 Rectocele: Secondary | ICD-10-CM

## 2015-12-01 DIAGNOSIS — IMO0002 Reserved for concepts with insufficient information to code with codable children: Secondary | ICD-10-CM

## 2015-12-01 NOTE — Patient Instructions (Signed)

## 2015-12-01 NOTE — Progress Notes (Signed)
    GYNECOLOGY PROGRESS NOTE  Subjective:    Patient ID: Kimberly Berger, female    DOB: Oct 20, 1950, 65 y.o.   MRN: [id number]  HPI  Patient is a 65 y.o. G3P0 female who presents as a referral from the [facility] program for evaluation of uterine prolapse.  Patient notes that the prolapse has been going on for "a long time" (at least several years).  Notes that her symptoms have gradually gotten worse.  Notes pelvic pressure symptoms, low back pain, and a bulge when attempting to void, have a bowel movement, or perform any lifting or strenous activity.  Denies splinting, urinary incontinence, or bowel disturbances .    OB History  Gravida Para Term Preterm AB SAB TAB Ectopic Multiple Living  3             # Outcome Date GA Lbr Len/2nd Weight Sex Delivery Anes PTL Lv  3 Gravida           2 Gravida           1 Gravida               History reviewed. No pertinent past medical history.    History reviewed. No pertinent family history.    History reviewed. No pertinent past surgical history.   Social History   Social History  . Marital Status: Married    Spouse Name: N/A  . Number of Children: N/A  . Years of Education: N/A   Occupational History  . Not on file.   Social History Main Topics  . Smoking status: Current Every Day Smoker    Types: Cigarettes  . Smokeless tobacco: Not on file  . Alcohol Use: No  . Drug Use: No  . Sexual Activity: Yes   Other Topics Concern  . Not on file   Social History Narrative     Review of Systems Pertinent items noted in HPI and remainder of comprehensive ROS otherwise negative.   Objective:   Blood pressure 148/67, pulse 75, height  (1.575 m), weight 111 lb 12.8 oz (50.712 kg). General appearance: alert and no distress Abdomen: soft, non-tender; bowel sounds normal; no masses,  no organomegaly Pelvic: external genitalia normal, rectovaginal septum normal and vagina normal without discharge, atrophy present. Cervix visible at  hymenal ring, with small amount of further descensus on Valsalva. Grade 1 cystocele and rectocele present. Uterus normal size, shape, consistency. Adnexae nontender, no palpable.  Extremities: extremities normal, atraumatic, no cyanosis or edema Neurologic: Grossly normal   Assessment:   Uterine prolapse (Grade 3) Cystocele (Grade 1) Rectocele (Grade 1)  Plan:   Discussion had regarding diagnosis.  Discussed management options including conservative (pessary) vs surgical (hysterectomy with possible A/P repair).  Patient desires management with pessary.  Will return in 1 month for pessary fitting (patient notes she is self-pay and will need some time financially prior to next visit).    Hildred Laser, MD [facility]

## 2016-01-12 ENCOUNTER — Ambulatory Visit: Payer: Self-pay | Admitting: Obstetrics and Gynecology

## 2016-01-31 ENCOUNTER — Ambulatory Visit: Payer: Self-pay | Admitting: Obstetrics and Gynecology

## 2016-03-14 ENCOUNTER — Ambulatory Visit: Payer: Self-pay

## 2016-03-20 ENCOUNTER — Ambulatory Visit: Payer: Self-pay | Admitting: Obstetrics and Gynecology

## 2016-03-28 ENCOUNTER — Ambulatory Visit: Payer: Self-pay

## 2016-04-04 ENCOUNTER — Ambulatory Visit: Payer: Self-pay | Attending: Internal Medicine

## 2016-04-04 ENCOUNTER — Ambulatory Visit
Admission: RE | Admit: 2016-04-04 | Discharge: 2016-04-04 | Disposition: A | Discharge status: Home / Self Care | Payer: Self-pay | Source: Ambulatory Visit | Attending: Internal Medicine | Admitting: Internal Medicine

## 2016-04-04 VITALS — BP 130/69 | HR 89 | Temp 98.1°F | Resp 20 | Ht 64.57 in | Wt 109.6 lb

## 2016-04-04 DIAGNOSIS — Z0000 Encounter for general adult medical examination without abnormal findings: Secondary | ICD-10-CM

## 2016-04-04 NOTE — Progress Notes (Signed)
Subjective:     Patient ID: Kimberly Berger, female   DOB: 02-Apr-1951, 65 y.o.   MRN: [id number]  HPI   Review of Systems     Objective:   Physical Exam  Pulmonary/Chest: Right breast exhibits no inverted nipple, no mass, no nipple discharge, no skin change and no tenderness. Left breast exhibits no inverted nipple, no mass, no nipple discharge, no skin change and no tenderness. Breasts are symmetrical.  Genitourinary: No labial fusion. There is no rash, tenderness, lesion or injury on the right labia. There is no rash, tenderness, lesion or injury on the left labia. Uterus is not deviated, not enlarged, not fixed and not tender. Cervix exhibits no motion tenderness, no discharge and no friability. Right adnexum displays no mass, no tenderness and no fullness. Left adnexum displays no mass, no tenderness and no fullness. No erythema, tenderness or bleeding in the vagina. No foreign body around the vagina. No signs of injury around the vagina. No vaginal discharge found.  Uterine prolpase noted in vaginal opening       Assessment:     65 year old [facility].  Patient screened, and meets [facility] eligibility.  Patient does not have insurance, Medicare or Medicaid.  Handout given on Affordable Care Act. Instructed patient on breast self-exam using teach back method.  CBE unremarkable.  No mass or lump palpated.  Pelvic exam reveals uterine prolapse. Patient is seeing Dr. Valentino Saxonherry, and considering pessary.    Plan:     Sent for bilateral screening mammogram.  Specimen collected for pap.

## 2016-04-08 NOTE — Progress Notes (Signed)
Letter mailed from [facility] to notify of normal mammogram results.  Patient to return in one year for annual screening.  Pap results pending. 

## 2016-04-10 LAB — PAP LB AND HPV HIGH-RISK: PAP Smear Comment: 0

## 2016-04-10 LAB — HPV, LOW VOLUME (REFLEX): HPV low volume reflex: NEGATIVE

## 2016-04-14 ENCOUNTER — Encounter: Payer: Self-pay | Admitting: Emergency Medicine

## 2016-04-14 ENCOUNTER — Emergency Department
Admission: EM | Admit: 2016-04-14 | Discharge: 2016-04-14 | Disposition: A | Discharge status: Home / Self Care | Payer: Self-pay | Attending: Emergency Medicine | Admitting: Emergency Medicine

## 2016-04-14 DIAGNOSIS — F17210 Nicotine dependence, cigarettes, uncomplicated: Secondary | ICD-10-CM | POA: Insufficient documentation

## 2016-04-14 DIAGNOSIS — Z791 Long term (current) use of non-steroidal anti-inflammatories (NSAID): Secondary | ICD-10-CM | POA: Insufficient documentation

## 2016-04-14 DIAGNOSIS — R1032 Left lower quadrant pain: Secondary | ICD-10-CM | POA: Insufficient documentation

## 2016-04-14 LAB — CBC
HCT: 36.8 % (ref 35.0–47.0)
HEMOGLOBIN: 12.5 g/dL (ref 12.0–16.0)
MCH: 32.8 pg (ref 26.0–34.0)
MCHC: 34 g/dL (ref 32.0–36.0)
MCV: 96.6 fL (ref 80.0–100.0)
PLATELETS: 232 10*3/uL (ref 150–440)
RBC: 3.81 MIL/uL (ref 3.80–5.20)
RDW: 13.4 % (ref 11.5–14.5)
WBC: 5.3 10*3/uL (ref 3.6–11.0)

## 2016-04-14 LAB — COMPREHENSIVE METABOLIC PANEL
ALK PHOS: 85 U/L (ref 38–126)
ALT: 14 U/L (ref 14–54)
ANION GAP: 7 (ref 5–15)
AST: 20 U/L (ref 15–41)
Albumin: 3.7 g/dL (ref 3.5–5.0)
BUN: 10 mg/dL (ref 6–20)
CALCIUM: 8.8 mg/dL — AB (ref 8.9–10.3)
CO2: 27 mmol/L (ref 22–32)
CREATININE: 0.88 mg/dL (ref 0.44–1.00)
Chloride: 108 mmol/L (ref 101–111)
Glucose, Bld: 94 mg/dL (ref 65–99)
Potassium: 3.8 mmol/L (ref 3.5–5.1)
SODIUM: 142 mmol/L (ref 135–145)
TOTAL PROTEIN: 6.6 g/dL (ref 6.5–8.1)
Total Bilirubin: 0.4 mg/dL (ref 0.3–1.2)

## 2016-04-14 LAB — URINALYSIS COMPLETE WITH MICROSCOPIC (ARMC ONLY)
BILIRUBIN URINE: NEGATIVE
Bacteria, UA: NONE SEEN
Glucose, UA: NEGATIVE mg/dL
Hgb urine dipstick: NEGATIVE
KETONES UR: NEGATIVE mg/dL
LEUKOCYTES UA: NEGATIVE
NITRITE: NEGATIVE
PH: 6 (ref 5.0–8.0)
PROTEIN: NEGATIVE mg/dL
SPECIFIC GRAVITY, URINE: 1.014 (ref 1.005–1.030)

## 2016-04-14 LAB — LIPASE, BLOOD: Lipase: 21 U/L (ref 11–51)

## 2016-04-14 MED ORDER — ACETAMINOPHEN 325 MG PO TABS
650.0000 mg | ORAL_TABLET | Freq: Once | ORAL | Status: AC
  Administered 2016-04-14: 650 mg via ORAL
  Filled 2016-04-14: qty 2

## 2016-04-14 NOTE — ED Provider Notes (Signed)
[facility] Emergency Department Provider Note   ____________________________________________  Time seen: Approximately  I have reviewed the triage vital signs and the triage nursing note.  HISTORY  Chief Complaint Abdominal Pain   Historian Patient  HPI Kimberly Berger is a 65 y.o. female with history of uterine prolapse is followed by gynecologist Dr. Valentino Saxonherry, and patient is here for evaluation for pelvic and left lower abdominal pain. She states this pain has been ongoing for several months, but the last couple weeks seems to be in more pronounced. No bowel changes including constipation or diarrhea. No fevers. No black or bloody stools. No fevers. No weight loss or night sweats. Patient herself states that she wanted to be checked for urinary tract infection and she thinks her symptoms are probably from the prolapse. She states that she is saving up her money so that she can be fitted for pessary.  She's been taking ibuprofen at home.    History reviewed. No pertinent past medical history.  There are no active problems to display for this patient.   History reviewed. No pertinent past surgical history.  Current Outpatient Rx  Name  Route  Sig  Dispense  Refill  . ibuprofen (ADVIL,MOTRIN) 600 MG tablet   Oral   Take 1 tablet (600 mg total) by mouth every 8 (eight) hours as needed for moderate pain (with food).   15 tablet   0     Allergies Review of patient's allergies indicates no known allergies.  Family History  Problem Relation Age of Onset  . Breast cancer Maternal Aunt     Social History Social History  Substance Use Topics  . Smoking status: Current Every Day Smoker    Types: Cigarettes  . Smokeless tobacco: None  . Alcohol Use: No    Review of Systems  Constitutional: Negative for fever. Eyes: Negative for visual changes. ENT: Negative for sore throat. Cardiovascular: Negative for chest pain. Respiratory: Negative for  shortness of breath. Gastrointestinal: Negative for vomiting and diarrhea. Genitourinary: Negative for dysuria. Musculoskeletal: Negative for back pain. Skin: Negative for rash. Neurological: Negative for headache. 10 point Review of Systems otherwise negative ____________________________________________   PHYSICAL EXAM:  VITAL SIGNS: ED Triage Vitals  Enc Vitals Group     BP 04/14/16 1208 117/80 mmHg     Pulse Rate 04/14/16 1208 90     Resp 04/14/16 1208 20     Temp 04/14/16 1208 97.9 F (36.6 C)     Temp Source 04/14/16 1208 Oral     SpO2 04/14/16 1208 100 %     Weight 04/14/16 1208 109 lb (49.442 kg)     Height 04/14/16 1208 5\' 4"  (1.626 m)     Head Cir --      Peak Flow --      Pain Score 04/14/16 1209 9     Pain Loc --      Pain Edu? --      Excl. in GC? --      Constitutional: Alert and oriented. Well appearing and in no distress. HEENT   Head: Normocephalic and atraumatic.      Eyes: Conjunctivae are normal. PERRL. Normal extraocular movements.      Ears:         Nose: No congestion/rhinnorhea.   Mouth/Throat: Mucous membranes are moist.   Neck: No stridor. Cardiovascular/Chest: Normal rate, regular rhythm.  No murmurs, rubs, or gallops. Respiratory: Normal respiratory effort without tachypnea nor retractions. Breath sounds are clear and  equal bilaterally. No wheezes/rales/rhonchi. Gastrointestinal: Soft. No distention, no guarding, no rebound. No organomegaly or masses.  Mild discomfort left lower abdomen Genitourinary/rectal:Uterine prolapse Musculoskeletal: Nontender with normal range of motion in all extremities. No joint effusions.  No lower extremity tenderness.  No edema. Neurologic:  Normal speech and language. No gross or focal neurologic deficits are appreciated. Skin:  Skin is warm, dry and intact. No rash noted. Psychiatric: Mood and affect are normal. Speech and behavior are normal. Patient exhibits appropriate insight and  judgment.  ____________________________________________   EKG I, Governor Rooksebecca Nicko Daher, MD, the attending physician have personally viewed and interpreted all ECGs.  None ____________________________________________  LABS (pertinent positives/negatives)  Labs Reviewed  COMPREHENSIVE METABOLIC PANEL - Abnormal; Notable for the following:    Calcium 8.8 (*)    All other components within normal limits  URINALYSIS COMPLETEWITH MICROSCOPIC ([facility] ONLY) - Abnormal; Notable for the following:    Color, Urine YELLOW (*)    APPearance CLEAR (*)    Squamous Epithelial / LPF 0-5 (*)    All other components within normal limits  LIPASE, BLOOD  CBC    ____________________________________________  RADIOLOGY All Xrays were viewed by me. Imaging interpreted by Radiologist.  None __________________________________________  PROCEDURES  Procedure(s) performed: None  Critical Care performed: None  ____________________________________________   ED COURSE / ASSESSMENT AND PLAN  Pertinent labs & imaging results that were available during my care of the patient were reviewed by me and considered in my medical decision making (see chart for details).   This patient has been having ongoing lower abdominal discomfort which she feels like is due to uterine prolapse which was diagnosed with her OB/GYN doctor several weeks ago. She also states that when she's felt this way previously showed a urinary tract infection.  On exam today she is well-appearing, very mild discomfort when palpating in the left lower quadrant. She does have uterine prolapse on exam. Her urinalysis is negative for urinary tract infection. Her white blood cell count and hemoglobin are reassuring. Her electrolyte panel is normal.  This patient was asking for something stronger than ibuprofen. I discussed with her that I am not comfortable prescribing narcotics for what sounds like chronic pain. I'll give her follow-up with her  primary care physician and potentially be referred to pain clinic, and certainly follow up with OB/GYN for the uterine prolapse treatment.  I'm not highly suspicious of another emergency source of her discomfort today. We did discuss return precautions. Patient is comfort with this plan of management. She is going to take some additional Tylenol here.  CONSULTATIONS:  None  Patient / Family / Caregiver informed of clinical course, medical decision-making process, and agree with plan.   I discussed return precautions, follow-up instructions, and discharged instructions with patient and/or family.   ___________________________________________   FINAL CLINICAL IMPRESSION(S) / ED DIAGNOSES   Final diagnoses:  Left lower quadrant pain              Note: This dictation was prepared with Dragon dictation. Any transcriptional errors that result from this process are unintentional   Governor Rooksebecca Karmello Abercrombie, MD 04/14/16 1328

## 2016-04-14 NOTE — ED Notes (Signed)
Having lower back and abd pain for about 2 weeks  Positive nausea   No vomiting or fever

## 2016-04-14 NOTE — Discharge Instructions (Signed)
You were evaluated for ongoing lower abdominal pain and although no certain cause was found your exam and evaluation are reassuring in the ER today.  We discussed, I do suspect your prolapsed uterus is probably giving her symptoms. We discussed holding off on CT scan abdomen today based on your reassuring exam and labs today.  Follow-up with your primary care physician as well as your OB/GYN. Next and return to emergency department for any worsening pain, black or bloody stools, vomiting, fever, or any other symptoms concerning to you.  Pelvic Organ Prolapse Pelvic organ prolapse is the stretching, bulging, or dropping of pelvic organs into an abnormal position. It happens when the muscles and tissues that surround and support pelvic structures are stretched or weak. Pelvic organ prolapse can involve:  Vagina (vaginal prolapse).  Uterus (uterine prolapse).  Bladder (cystocele).  Rectum (rectocele).  Intestines (enterocele). When organs other than the vagina are involved, they often bulge into the vagina or protrude from the vagina, depending on how severe the prolapse is. CAUSES Causes of this condition include:  Pregnancy, labor, and childbirth.  Long-lasting (chronic) cough.  Chronic constipation.  Obesity.  Past pelvic surgery.  Aging. During and after menopause, a decreased production of the hormone estrogen can weaken pelvic ligaments and muscles.  Consistently lifting more than 50 lb (23 kg).  Buildup of fluid in the abdomen due to certain diseases and other conditions. SYMPTOMS Symptoms of this condition include:  Loss of bladder control when you cough, sneeze, strain, and exercise (stress incontinence). This may be worse immediately following childbirth, and it may gradually improve over time.  Feeling pressure in your pelvis or vagina. This pressure may increase when you cough or when you are having a bowel movement.  A bulge that protrudes from the opening of  your vagina or against your vaginal wall. If your uterus protrudes through the opening of your vagina and rubs against your clothing, you may also experience soreness, ulcers, infection, pain, and bleeding.  Increased effort to have a bowel movement or urinate.  Pain in your low back.  Pain, discomfort, or disinterest in sexual intercourse.  Repeated bladder infections (urinary tract infections).  Difficulty inserting or inability to insert a tampon or applicator. In some people, this condition does not cause any symptoms. DIAGNOSIS Your health care provider may perform an internal and external vaginal and rectal exam. During the exam, you may be asked to cough and strain while you are lying down, sitting, and standing up. Your health care provider will determine if other tests are required, such as bladder function tests. TREATMENT In most cases, this condition needs to be treated only if it produces symptoms. No treatment is guaranteed to correct the prolapse or relieve the symptoms completely. Treatment may include:  Lifestyle changes, such as:  Avoiding drinking beverages that contain caffeine.  Increasing your intake of high-fiber foods. This can help to decrease constipation and straining during bowel movements.  Emptying your bladder at scheduled times (bladder training therapy). This can help to reduce or avoid urinary incontinence.  Losing weight if you are overweight or obese.  Estrogen. Estrogen may help mild prolapse by increasing the strength and tone of pelvic floor muscles.  Kegel exercises. These may help mild cases of prolapse by strengthening and tightening the muscles of the pelvic floor.  Pessary insertion. A pessary is a soft, flexible device that is placed into your vagina by your health care provider to help support the vaginal walls and keep pelvic  organs in place.  Surgery. This is often the only form of treatment for severe prolapse. Different types of  surgeries are available. HOME CARE INSTRUCTIONS  Wear a sanitary pad or absorbent product if you have urinary incontinence.  Avoid heavy lifting and straining with exercise and work. Do not hold your breath when you perform mild to moderate lifting and exercise activities. Limit your activities as directed by your health care provider.  Take medicines only as directed by your health care provider.  Perform Kegel exercises as directed by your health care provider.  If you have a pessary, take care of it as directed by your health care provider. SEEK MEDICAL CARE IF:  Your symptoms interfere with your daily activities or sex life.  You need medicine to help with the discomfort.  You notice bleeding from the vagina that is not related to your period.  You have a fever.  You have pain or bleeding when you urinate.  You have bleeding when you have a bowel movement.  You lose urine when you have sex.  You have chronic constipation.  You have a pessary that falls out.  You have vaginal discharge that has a bad smell.  You have low abdominal pain or cramping that is unusual for you.   This information is not intended to replace advice given to you by your health care provider. Make sure you discuss any questions you have with your health care provider.   Document Released: 04/28/2014 Document Reviewed: 04/28/2014 Elsevier Interactive Patient Education [employer]2016 [employer] Inc.

## 2016-04-23 NOTE — Progress Notes (Signed)
Phoned patient with normal mammogram, and pap results.  Per guidelines , next pap due in 5 years.  Patient to return next year for mammogram.  Copy to [facility].

## 2016-07-06 ENCOUNTER — Encounter: Payer: Self-pay | Admitting: Emergency Medicine

## 2016-07-06 ENCOUNTER — Emergency Department: Payer: Self-pay

## 2016-07-06 ENCOUNTER — Emergency Department
Admission: EM | Admit: 2016-07-06 | Discharge: 2016-07-06 | Disposition: A | Discharge status: Home / Self Care | Payer: Self-pay | Attending: Emergency Medicine | Admitting: Emergency Medicine

## 2016-07-06 DIAGNOSIS — M19011 Primary osteoarthritis, right shoulder: Secondary | ICD-10-CM | POA: Insufficient documentation

## 2016-07-06 DIAGNOSIS — R1013 Epigastric pain: Secondary | ICD-10-CM | POA: Insufficient documentation

## 2016-07-06 DIAGNOSIS — F17210 Nicotine dependence, cigarettes, uncomplicated: Secondary | ICD-10-CM | POA: Insufficient documentation

## 2016-07-06 DIAGNOSIS — M19019 Primary osteoarthritis, unspecified shoulder: Secondary | ICD-10-CM

## 2016-07-06 DIAGNOSIS — Z791 Long term (current) use of non-steroidal anti-inflammatories (NSAID): Secondary | ICD-10-CM | POA: Insufficient documentation

## 2016-07-06 LAB — URINALYSIS COMPLETE WITH MICROSCOPIC (ARMC ONLY)
BACTERIA UA: NONE SEEN
Bilirubin Urine: NEGATIVE
GLUCOSE, UA: NEGATIVE mg/dL
Ketones, ur: NEGATIVE mg/dL
Leukocytes, UA: NEGATIVE
Nitrite: NEGATIVE
PROTEIN: NEGATIVE mg/dL
RBC / HPF: NONE SEEN RBC/hpf (ref 0–5)
SQUAMOUS EPITHELIAL / LPF: NONE SEEN
Specific Gravity, Urine: 1.004 — ABNORMAL LOW (ref 1.005–1.030)
WBC UA: NONE SEEN WBC/hpf (ref 0–5)
pH: 7 (ref 5.0–8.0)

## 2016-07-06 LAB — CBC
HEMATOCRIT: 37.9 % (ref 35.0–47.0)
HEMOGLOBIN: 13.2 g/dL (ref 12.0–16.0)
MCH: 33.6 pg (ref 26.0–34.0)
MCHC: 34.8 g/dL (ref 32.0–36.0)
MCV: 96.5 fL (ref 80.0–100.0)
Platelets: 218 10*3/uL (ref 150–440)
RBC: 3.92 MIL/uL (ref 3.80–5.20)
RDW: 13.7 % (ref 11.5–14.5)
WBC: 4.8 10*3/uL (ref 3.6–11.0)

## 2016-07-06 LAB — COMPREHENSIVE METABOLIC PANEL
ALBUMIN: 3.6 g/dL (ref 3.5–5.0)
ALK PHOS: 77 U/L (ref 38–126)
ALT: 13 U/L — ABNORMAL LOW (ref 14–54)
ANION GAP: 4 — AB (ref 5–15)
AST: 19 U/L (ref 15–41)
BUN: 11 mg/dL (ref 6–20)
CALCIUM: 8.9 mg/dL (ref 8.9–10.3)
CHLORIDE: 107 mmol/L (ref 101–111)
CO2: 30 mmol/L (ref 22–32)
Creatinine, Ser: 0.65 mg/dL (ref 0.44–1.00)
GFR calc Af Amer: >60 mL/min (ref >60)
GFR calc non Af Amer: >60 mL/min (ref >60)
GLUCOSE: 93 mg/dL (ref 65–99)
Potassium: 4.2 mmol/L (ref 3.5–5.1)
SODIUM: 141 mmol/L (ref 135–145)
Total Bilirubin: 0.6 mg/dL (ref 0.3–1.2)
Total Protein: 6.5 g/dL (ref 6.5–8.1)

## 2016-07-06 LAB — TROPONIN I

## 2016-07-06 LAB — LIPASE, BLOOD: LIPASE: 25 U/L (ref 11–51)

## 2016-07-06 MED ORDER — GI COCKTAIL ~~LOC~~: 30.0000 mL | Freq: Once | ORAL | Status: DC

## 2016-07-06 MED ORDER — SODIUM CHLORIDE 0.9 % IV BOLUS (SEPSIS): 1000.0000 mL | Freq: Once | INTRAVENOUS | Status: DC

## 2016-07-06 MED ORDER — FAMOTIDINE 20 MG PO TABS: 20.0000 mg | ORAL_TABLET | Freq: Two times a day (BID) | ORAL | 0 refills | Status: DC | PRN

## 2016-07-06 MED ORDER — ESOMEPRAZOLE MAGNESIUM 40 MG PO CPDR: 40.0000 mg | DELAYED_RELEASE_CAPSULE | Freq: Every day | ORAL | 0 refills | Status: DC

## 2016-07-06 MED ORDER — FAMOTIDINE IN NACL 20-0.9 MG/50ML-% IV SOLN: 20.0000 mg | Freq: Once | INTRAVENOUS | Status: DC

## 2016-07-06 NOTE — ED Provider Notes (Signed)
[facility]-EMERGENCY DEPARTMENT Provider Note   CSN: [id number] Arrival date & time: 07/06/16  4098     History   Chief Complaint Chief Complaint  Patient presents with  . Abdominal Pain    HPI Kimberly Berger is a 65 y.o. female always healthy here presenting with right shoulder pain, epigastric pain. 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 Denies any fall or injury. She doctor her doctor and was thought to have arthritis pain was put on Motrin. For the last week or so, She has been having intermittent epigastric pain. She states that it's constant but not worse with eating. Denies any vomiting or history of gallstones. Denies any fevers or chills.    The history is provided by the patient.    History reviewed. No pertinent past medical history.  There are no active problems to display for this patient.   History reviewed. No pertinent surgical history.  OB History    Gravida Para Term Preterm AB Living   3             SAB TAB Ectopic Multiple Live Births                   Home Medications    Prior to Admission medications   Medication Sig Start Date End Date Taking? Authorizing Provider  ibuprofen (ADVIL,MOTRIN) 600 MG tablet Take 1 tablet (600 mg total) by mouth every 8 (eight) hours as needed for moderate pain (with food). 08/04/15   Rockne Menghini, MD    Family History Family History  Problem Relation Age of Onset  . Breast cancer Maternal Aunt     Social History Social History  Substance Use Topics  . Smoking status: Current Every Day Smoker    Types: Cigarettes  . Smokeless tobacco: Not on file  . Alcohol use No     Allergies   Review of patient's allergies indicates no known allergies.   Review of Systems Review of Systems  Gastrointestinal: Positive for abdominal pain.  All other systems reviewed and are negative.    Physical  Exam Updated Vital Signs BP (!) 160/67 (BP Location: Right Arm)   Pulse 72   Temp 97.6 F (36.4 C) (Oral)   Resp 18   Ht 5\' 4"  (1.626 m)   Wt 120 lb (54.4 kg)   SpO2 98%   BMI 20.60 kg/m   Physical Exam  Constitutional: She is oriented to person, place, and time.  Slightly uncomfortable   HENT:  Head: Normocephalic.  Eyes: EOM are normal. Pupils are equal, round, and reactive to light.  Neck: Normal range of motion. Neck supple.  Cardiovascular: Normal rate, regular rhythm and normal heart sounds.   Pulmonary/Chest: Effort normal and breath sounds normal. No respiratory distress. She has no wheezes. She has no rales.  Abdominal: Soft. Bowel sounds are normal.  Mild epigastric tenderness, no rebound   Musculoskeletal: Normal range of motion.  R shoulder with some dec ROM from pain. No obvious deformity   Neurological: She is alert and oriented to person, place, and time.  Skin: Skin is warm.  Psychiatric: She has a normal mood and affect.  Nursing note and vitals reviewed.    ED Treatments / Results  Labs (all labs ordered are listed, but only abnormal results are displayed) Labs Reviewed  COMPREHENSIVE METABOLIC PANEL - Abnormal; Notable for the following:  Result Value   ALT 13 (*)    Anion gap 4 (*)    All other components within normal limits  URINALYSIS COMPLETEWITH MICROSCOPIC ([facility] ONLY) - Abnormal; Notable for the following:    Color, Urine STRAW (*)    APPearance CLEAR (*)    Specific Gravity, Urine 1.004 (*)    Hgb urine dipstick 1+ (*)    All other components within normal limits  LIPASE, BLOOD  CBC  TROPONIN I    EKG  EKG Interpretation None      ED ECG REPORT I, Richardean Canalavid H Alanii Ramer, the attending physician, personally viewed and interpreted this ECG.   Date: 07/06/2016  EKG Time: 9:31 am  Rate: 78  Rhythm: normal EKG, normal sinus rhythm  Axis: normal  Intervals:none  ST&T Change: nonspecific    Radiology Dg Chest 2 View  Result  Date: 07/06/2016 CLINICAL DATA:  Chronic productive cough, history of tobacco use EXAM: CHEST  2 VIEW COMPARISON:  08/04/2015 FINDINGS: Cardiac shadow is stable. The lungs are well aerated bilaterally. Mild interstitial changes are again seen. Some scarring is noted in the left lung base stable from the prior exam. No new focal abnormality is seen. No focal infiltrate or sizable effusion is seen. No bony abnormality is noted. IMPRESSION: No active cardiopulmonary disease. Electronically Signed   By: Alcide CleverMark  Lukens M.D.   On: 07/06/2016 11:39   Dg Shoulder Right  Result Date: 07/06/2016 CLINICAL DATA:  Chronic right shoulder pain with no known injury EXAM: RIGHT SHOULDER - 2+ VIEW COMPARISON:  8126 soon FINDINGS: No acute fracture or dislocation. Osteopenia. Mild spurring at the glenohumeral joint without narrowing. Mildly high humeral head, which can be a sign of chronic rotator cuff tear, without subacromial stenosis. IMPRESSION: 1. No acute finding. 2. Mild glenohumeral spurring without joint narrowing. Electronically Signed   By: Marnee SpringJonathon  Watts M.D.   On: 07/06/2016 11:41   [place] Abdomen Complete  Result Date: 07/06/2016 CLINICAL DATA:  Epigastric abdominal pain for 2 weeks. EXAM: ABDOMEN ULTRASOUND COMPLETE COMPARISON:  None. FINDINGS: Gallbladder: No gallstones or wall thickening visualized. No sonographic Murphy sign noted by sonographer. Common bile duct: Diameter: 2 mm Liver: No focal lesion identified. Within normal limits in parenchymal echogenicity. IVC: No abnormality visualized. Pancreas: Visualized portion unremarkable. Spleen: Size and appearance within normal limits. Right Kidney: Length: 10 cm. Echogenicity within normal limits. No mass or hydronephrosis visualized. Left Kidney: Length: 11 cm. Echogenicity within normal limits. No mass or hydronephrosis visualized. Abdominal aorta: No aneurysm visualized. Other findings: None. IMPRESSION: Normal abdominal ultrasound. Electronically Signed   By:  Marnee SpringJonathon  Watts M.D.   On: 07/06/2016 12:37    Procedures Procedures (including critical care time)  Medications Ordered in ED Medications  sodium chloride 0.9 % bolus 1,000 mL (not administered)  famotidine (PEPCID) IVPB 20 mg premix (not administered)  gi cocktail (Maalox,Lidocaine,Donnatal) (not administered)     Initial Impression / Assessment and Plan / ED Course  I have reviewed the triage vital signs and the nursing notes.  Pertinent labs & imaging results that were available during my care of the patient were reviewed by me and considered in my medical decision making (see chart for details).  Clinical Course    Wende NeighborsMary S Kelsay is a 65 y.o. female here with epigastric pain, R shoulder pain. Consider biliary colic vs gastritis vs gastric ulcer. Less likely ACS (symptoms for a week so trop x 1 sufficient), I doubt PE or dissection. Will get labs, ab US, UA,  CXR, R shoulder xray.     2:15 PM Xray showed arthritis. UA 1+ blood but no infection. Labs including lipase and trop neg. US unremarkable. Felt better. Likely gastritis. Will dc home with nexium, prn pepcid.   Final Clinical Impressions(s) / ED Diagnoses   Final diagnoses:  None    New Prescriptions New Prescriptions   No medications on file     Charlynne Pander, MD 07/06/16 1416

## 2016-07-06 NOTE — ED Notes (Signed)
Reports pain in right shoulder x 2 months, states she was told she has arthritis in it.  Worse with movement.  Also reports upper abd pain x 1 wk.  Denies n/v/d or any other sx.  Skin w/d, pt alert and smiling.

## 2016-07-06 NOTE — ED Triage Notes (Signed)
C/o epigastric pain X 2 weeks. Has been taking motrin and tums without relief. Also c/o right shoulder pain. Looks well in triage.

## 2016-07-06 NOTE — Discharge Instructions (Signed)
Take nexium 40 mg daily.   Take pepcid 20 mg twice daily as needed.   Stay hydrated.   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 Continue tylenol, motrin for pain.   See your doctor  Return to ER if you have severe pain, vomiting, fevers.

## 2016-08-30 ENCOUNTER — Encounter: Payer: Self-pay | Admitting: Emergency Medicine

## 2016-08-30 ENCOUNTER — Emergency Department
Admission: EM | Admit: 2016-08-30 | Discharge: 2016-08-30 | Disposition: A | Discharge status: Home / Self Care | Payer: Self-pay | Attending: Emergency Medicine | Admitting: Emergency Medicine

## 2016-08-30 DIAGNOSIS — S39012A Strain of muscle, fascia and tendon of lower back, initial encounter: Secondary | ICD-10-CM | POA: Insufficient documentation

## 2016-08-30 DIAGNOSIS — Y929 Unspecified place or not applicable: Secondary | ICD-10-CM | POA: Insufficient documentation

## 2016-08-30 DIAGNOSIS — M12811 Other specific arthropathies, not elsewhere classified, right shoulder: Secondary | ICD-10-CM

## 2016-08-30 DIAGNOSIS — K219 Gastro-esophageal reflux disease without esophagitis: Secondary | ICD-10-CM | POA: Insufficient documentation

## 2016-08-30 DIAGNOSIS — F17210 Nicotine dependence, cigarettes, uncomplicated: Secondary | ICD-10-CM | POA: Insufficient documentation

## 2016-08-30 DIAGNOSIS — Y939 Activity, unspecified: Secondary | ICD-10-CM | POA: Insufficient documentation

## 2016-08-30 DIAGNOSIS — X58XXXA Exposure to other specified factors, initial encounter: Secondary | ICD-10-CM | POA: Insufficient documentation

## 2016-08-30 DIAGNOSIS — M129 Arthropathy, unspecified: Secondary | ICD-10-CM | POA: Insufficient documentation

## 2016-08-30 DIAGNOSIS — Y999 Unspecified external cause status: Secondary | ICD-10-CM | POA: Insufficient documentation

## 2016-08-30 LAB — CBC
HEMATOCRIT: 33.1 % — AB (ref 35.0–47.0)
HEMOGLOBIN: 10.6 g/dL — AB (ref 12.0–16.0)
MCH: 30.9 pg (ref 26.0–34.0)
MCHC: 32 g/dL (ref 32.0–36.0)
MCV: 96.7 fL (ref 80.0–100.0)
PLATELETS: 182 10*3/uL (ref 150–440)
RBC: 3.43 MIL/uL — AB (ref 3.80–5.20)
RDW: 13.8 % (ref 11.5–14.5)
WBC: 5 10*3/uL (ref 3.6–11.0)

## 2016-08-30 LAB — URINALYSIS COMPLETE WITH MICROSCOPIC (ARMC ONLY)
BILIRUBIN URINE: NEGATIVE
Bacteria, UA: NONE SEEN
Glucose, UA: NEGATIVE mg/dL
KETONES UR: NEGATIVE mg/dL
Leukocytes, UA: NEGATIVE
NITRITE: NEGATIVE
PH: 6 (ref 5.0–8.0)
PROTEIN: NEGATIVE mg/dL
SPECIFIC GRAVITY, URINE: 1.021 (ref 1.005–1.030)
WBC UA: NONE SEEN WBC/hpf (ref 0–5)

## 2016-08-30 LAB — BASIC METABOLIC PANEL
ANION GAP: 4 — AB (ref 5–15)
BUN: 11 mg/dL (ref 6–20)
CHLORIDE: 108 mmol/L (ref 101–111)
CO2: 30 mmol/L (ref 22–32)
Calcium: 9.1 mg/dL (ref 8.9–10.3)
Creatinine, Ser: 0.79 mg/dL (ref 0.44–1.00)
GFR calc Af Amer: >60 mL/min (ref >60)
GLUCOSE: 77 mg/dL (ref 65–99)
POTASSIUM: 3.9 mmol/L (ref 3.5–5.1)
Sodium: 142 mmol/L (ref 135–145)

## 2016-08-30 MED ORDER — GI COCKTAIL ~~LOC~~
30.0000 mL | Freq: Once | ORAL | Status: AC
  Administered 2016-08-30: 30 mL via ORAL
  Filled 2016-08-30: qty 30

## 2016-08-30 MED ORDER — CYCLOBENZAPRINE HCL 5 MG PO TABS: 5.0000 mg | ORAL_TABLET | Freq: Three times a day (TID) | ORAL | 0 refills | Status: DC | PRN

## 2016-08-30 MED ORDER — PREDNISONE 10 MG PO TABS: 10.0000 mg | ORAL_TABLET | Freq: Every day | ORAL | 0 refills | Status: DC

## 2016-08-30 NOTE — ED Triage Notes (Signed)
Pt to ED c/o neck, back and epigastric pain for several days.  States nausea, no vomiting.  Denies injury.

## 2016-08-30 NOTE — Discharge Instructions (Signed)
Please continue with home reflux medications as needed. Avoid list of medications that are on your handout that reproduce reflux. Follow-up with PCP to discuss lab work and anemia. Take prednisone as prescribed and follow-up with orthopedics if continued shoulder and lower back pain. Return to the ER for any increase in pain worsening symptoms urgent changes in her health.

## 2016-08-30 NOTE — ED Provider Notes (Signed)
[facility]-EMERGENCY DEPARTMENT Provider Note   CSN: [id number] Arrival date & time: 08/30/16  1631     History   Chief Complaint Chief Complaint  Patient presents with  . Abdominal Pain  . Neck Pain  . Back Pain    HPI Kimberly Berger is a 65 y.o. female. Presents to the emergency department for evaluation of right shoulder pain. Pain is been present for 3 days. No trauma or injury. Denies any numbness or tingling. Describes right lateral deltoid and anterior shoulder aching that is increased with range of motion. Previous x-rays and September 2017 showed rotator cuff arthropathy with concern for chronic rotator cuff tear. Patient denies any trauma or injury. 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 She is currently not taking any medications for pain. She denies any medical problems.  Patient also complains of midline lower back pain along the left and right lower back at the lumbosacral junction that she describes as tightness and aching that is increased with lifting. Pain is 6 out of 10, has been present for years but a slightly increased over the last few days. She denies any trauma or injury. No numbness tingling or radicular symptoms in the lower extremity is. No loss of bowel or bladder symptoms. She denies any tearing sensation or pain radiating into the abdomen.  Patient does complain of epigastric pain for 3 days she describes this as bloating with no radiation of the pain. She denies any fevers, nausea, vomiting. Pain is increased after eating greasy foods. She denies any issues of constipation, has been regular. She has not been taking any anti-inflammatory medications.Patient denies any blood in her stool or with wiping. She denies feeling dizzy, lightheaded. She does have a history of iron deficiency anemia.   HPI  History reviewed. No pertinent past medical history.  There are no active problems to display for this patient.   History  reviewed. No pertinent surgical history.  OB History    Gravida Para Term Preterm AB Living   3             SAB TAB Ectopic Multiple Live Births                   Home Medications    Prior to Admission medications   Medication Sig Start Date End Date Taking? Authorizing Provider  cyclobenzaprine (FLEXERIL) 5 MG tablet Take 1-2 tablets (5-10 mg total) by mouth 3 (three) times daily as needed for muscle spasms. 08/30/16   Evon Slackhomas C Daijon Wenke, PA-C  esomeprazole (NEXIUM) 40 MG capsule Take 1 capsule (40 mg total) by mouth daily. 07/06/16 07/06/17  Charlynne Panderavid Hsienta Yao, MD  famotidine (PEPCID) 20 MG tablet Take 1 tablet (20 mg total) by mouth 2 (two) times daily as needed for heartburn or indigestion. 07/06/16 07/06/17  Charlynne Panderavid Hsienta Yao, MD  ibuprofen (ADVIL,MOTRIN) 600 MG tablet Take 1 tablet (600 mg total) by mouth every 8 (eight) hours as needed for moderate pain (with food). 08/04/15   Anne-Caroline Sharma CovertNorman, MD  predniSONE (DELTASONE) 10 MG tablet Take 1 tablet (10 mg total) by mouth daily. 6,5,4,3,2,1 six day taper 08/30/16   Evon Slackhomas C Chase Knebel, PA-C    Family History Family History  Problem Relation Age of Onset  . Breast cancer Maternal Aunt     Social History Social History  Substance Use Topics  . Smoking status: Current Every Day Smoker    Types: Cigarettes  . Smokeless tobacco: Never Used  . Alcohol use  No     Allergies   Patient has no known allergies.   Review of Systems Review of Systems  Constitutional: Negative for activity change, chills, fatigue and fever.  HENT: Negative for congestion, sinus pressure and sore throat.   Eyes: Negative for visual disturbance.  Respiratory: Negative for cough, chest tightness and shortness of breath.   Cardiovascular: Negative for chest pain and leg swelling.  Gastrointestinal: Positive for abdominal pain. Negative for blood in stool, constipation, diarrhea, nausea and vomiting.  Genitourinary: Negative for dysuria.  Musculoskeletal:  Positive for arthralgias and back pain. Negative for gait problem.  Skin: Negative for rash.  Neurological: Negative for weakness, numbness and headaches.  Hematological: Negative for adenopathy.  Psychiatric/Behavioral: Negative for agitation, behavioral problems and confusion.     Physical Exam Updated Vital Signs BP (!) 148/76 (BP Location: Left Arm)   Pulse 71   Temp 98 F (36.7 C) (Oral)   Resp 16   Ht 5\' 4"  (1.626 m)   Wt 52.2 kg   SpO2 97%   BMI 19.74 kg/m   Physical Exam  Constitutional: She is oriented to person, place, and time. She appears well-developed and well-nourished. No distress.  HENT:  Head: Normocephalic and atraumatic.  Right Ear: External ear normal.  Left Ear: External ear normal.  Nose: Nose normal.  Mouth/Throat: Oropharynx is clear and moist.  Eyes: Conjunctivae and EOM are normal. Pupils are equal, round, and reactive to light. Right eye exhibits no discharge. Left eye exhibits no discharge.  Neck: Normal range of motion. Neck supple.  Cardiovascular: Normal rate, regular rhythm and intact distal pulses.   Pulmonary/Chest: Effort normal and breath sounds normal. No respiratory distress. She exhibits no tenderness.  Abdominal: Soft. Bowel sounds are normal. She exhibits no distension and no mass. There is no tenderness. There is no rebound and no guarding. No hernia.  Musculoskeletal:  Examination of the cervical spine shows patient has no spinous process tenderness. Shows full range of motion with no discomfort. Negative Spurling's test bilaterally. Patient has painful right shoulder active range of motion with internal and external rotation. Positive Hawkins and impingement test. Weakness with supraspinatus strength testing. Grip strength is 5 out of 5 bilaterally. Examination of the lumbar spine shows patient has no spinous process tenderness and has mild left and right paravertebral muscle tenderness. Patient has full range of motion of the lumbar  spine. Patient is nontender over the trochanteric regions of the hips bilaterally. She has full range of motion of bilateral hips knees and ankles no discomfort.  Neurological: She is alert and oriented to person, place, and time. She has normal reflexes. She displays normal reflexes. No cranial nerve deficit or sensory deficit. She exhibits normal muscle tone. Coordination normal.  Skin: Skin is warm and dry. No rash noted. No erythema. No pallor.  Psychiatric: She has a normal mood and affect. Her behavior is normal. Judgment and thought content normal.     ED Treatments / Results  Labs (all labs ordered are listed, but only abnormal results are displayed) Labs Reviewed  CBC - Abnormal; Notable for the following:       Result Value   RBC 3.43 (*)    Hemoglobin 10.6 (*)    HCT 33.1 (*)    All other components within normal limits  BASIC METABOLIC PANEL - Abnormal; Notable for the following:    Anion gap 4 (*)    All other components within normal limits  URINALYSIS COMPLETEWITH MICROSCOPIC ([facility] ONLY) -  Abnormal; Notable for the following:    Color, Urine YELLOW (*)    APPearance CLEAR (*)    Hgb urine dipstick 1+ (*)    Squamous Epithelial / LPF 0-5 (*)    All other components within normal limits    EKG  EKG Interpretation None       Radiology No results found.  Procedures Procedures (including critical care time)  Medications Ordered in ED Medications  gi cocktail (Maalox,Lidocaine,Donnatal) (30 mLs Oral Given 08/30/16 1834)     Initial Impression / Assessment and Plan / ED Course  I have reviewed the triage vital signs and the nursing notes.  Pertinent labs & imaging results that were available during my care of the patient were reviewed by me and considered in my medical decision making (see chart for details).  Clinical Course     65 year old female with multiple medical complaints. Mostly right shoulder pain with lower back pain. Shoulder pain  consistent with rotator cuff arthropathy, rotator cuff arthropathy seen on previous x-rays. No trauma or injury. Will follow-up with orthopedics. Patient also with lower back pain, mechanical no trauma or injury or radiation. Flexeril as needed. Follow-up with orthopedics. Epigastric pain resolved with GI cocktail, abdominal exam unremarkable. Patient will take over-the-counter medications and she is given information on foods to avoid. Lab work shows mild anemia, she denies any dizziness, lightheadedness, blood in stool. She'll follow-up with PCP.  Final Clinical Impressions(s) / ED Diagnoses   Final diagnoses:  Strain of lumbar region, initial encounter  Rotator cuff arthropathy, right  Gastroesophageal reflux disease, esophagitis presence not specified    New Prescriptions New Prescriptions   CYCLOBENZAPRINE (FLEXERIL) 5 MG TABLET    Take 1-2 tablets (5-10 mg total) by mouth 3 (three) times daily as needed for muscle spasms.   PREDNISONE (DELTASONE) 10 MG TABLET    Take 1 tablet (10 mg total) by mouth daily. 6,5,4,3,2,1 six day taper     Evon Slack, PA-C 08/30/16 1957    Charlynne Pander, MD 08/30/16 [phone]

## 2016-10-25 ENCOUNTER — Telehealth: Payer: Self-pay | Admitting: Obstetrics and Gynecology

## 2016-10-25 NOTE — Telephone Encounter (Signed)
THIS PT WAS SUPPOSED TO COME TO GET HER PESSARY INSERTED BUT  SHE DIDN'T HAVE THE MONEY. ShE SAID HER UTERUS IOS PROLAPSED AND IT PRETTY BAD, SHE HAS STARTED SPOTTING NOW AND SHE WANTED TO COME IN TO GET HER PESSARY BUT THERE IS NO AVAILABLILITY UNTIL 2/7 SINCE CHERRY WILL BE ON [place]. DO YOU THINK SHE SHOULD COME SEE SOMEONE ELSE? I PUT HER ON 2/7. SHE ASKED ME IF IT GOT BAD SHOULD SHE GO TO er.

## 2016-10-25 NOTE — Telephone Encounter (Signed)
The patient can be scheduled with Dr.De or Dr.Evans I am sure they have availability before then. Thanks

## 2016-11-21 ENCOUNTER — Encounter: Payer: Self-pay | Admitting: Obstetrics and Gynecology

## 2016-12-10 ENCOUNTER — Emergency Department
Admission: EM | Admit: 2016-12-10 | Discharge: 2016-12-10 | Discharge status: Against Medical Advice | Payer: Self-pay | Attending: Emergency Medicine | Admitting: Emergency Medicine

## 2016-12-10 DIAGNOSIS — R109 Unspecified abdominal pain: Secondary | ICD-10-CM

## 2016-12-10 DIAGNOSIS — M25511 Pain in right shoulder: Secondary | ICD-10-CM | POA: Insufficient documentation

## 2016-12-10 DIAGNOSIS — F17210 Nicotine dependence, cigarettes, uncomplicated: Secondary | ICD-10-CM | POA: Insufficient documentation

## 2016-12-10 DIAGNOSIS — R350 Frequency of micturition: Secondary | ICD-10-CM | POA: Insufficient documentation

## 2016-12-10 DIAGNOSIS — R1032 Left lower quadrant pain: Secondary | ICD-10-CM | POA: Insufficient documentation

## 2016-12-10 MED ORDER — IBUPROFEN 400 MG PO TABS
600.0000 mg | ORAL_TABLET | Freq: Once | ORAL | Status: AC
Start: 2016-12-10 — End: 2016-12-10
  Administered 2016-12-10: 600 mg via ORAL
  Filled 2016-12-10: qty 2

## 2016-12-10 MED ORDER — KETOROLAC TROMETHAMINE 60 MG/2ML IM SOLN: 60.0000 mg | Freq: Once | INTRAMUSCULAR | Status: DC

## 2016-12-10 NOTE — Discharge Instructions (Signed)
You may take Tylenol or Motrin for ear pain. Please apply a heating pad or ice pack to your right shoulder for 10 minutes every 2 hours as needed for pain.  Today you left prior to completing a full evaluation, including blood work and urine test. I am unable to tell you why you have the symptoms you can to the emergency department with. Your leaving AGAINST MEDICAL ADVICE, and this could result in worsening of your symptoms, new symptoms, or death.  Return to the emergency department if you develop pain, fever, fainting, vomiting, diarrhea, or any other symptoms concerning to you.

## 2016-12-10 NOTE — ED Triage Notes (Signed)
Pt c/o LLQ pain for the past 2 weeks, BM today. Denies N/V/D/fever.. Pt also c/o right arm pain "it feels like bursitis"

## 2016-12-10 NOTE — ED Notes (Addendum)
Pt states she gave urine when she arrived - called lab to verify and they stated they do not have a urine sample for this pt - pt notified that we would need another urine sample - pt states she is not waiting any longer and that she is leaving - Dr Sharma CovertNorman notified and will be discharging pt

## 2016-12-10 NOTE — ED Provider Notes (Signed)
[facility] Emergency Department Provider Note  ____________________________________________  Time seen: Approximately 7:06 PM  I have reviewed the triage vital signs and the nursing notes.   HISTORY  Chief Complaint Abdominal Pain (LLQ) and Arm Pain    HPI Kimberly Berger is a 66 y.o. female with a history of pyelonephritis presenting with left flank and left lower quadrant pain, with urinary frequency; she also has right shoulder pain. The patient reports that her urinary symptoms have been ongoing for several days and that intermittently she will have a crampy sensation in the left flank and left lower quadrant. She has had urinary frequency but no dysuria, no hematuria. No nausea vomiting, fever or chills. In addition, for the past 2 weeks, the patient has pain in her dominant right shoulder which is most notable when she is doing physical work. 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 She describes a catching or popping sensation with pain with full elevation. Initially, this was happening every couple of days, and now she feels it every day. She denies any numbness or tingling, associated chest pain or shortness of breath. She has not noted a change in her grip strength.   History reviewed. No pertinent past medical history.  There are no active problems to display for this patient.   History reviewed. No pertinent surgical history.  Current Outpatient Rx  . Order #: [id number] Class: Print  . Order #: [id number] Class: Print  . Order #: [id number] Class: Print  . Order #: [id number] Class: Print  . Order #: [id number] Class: Print    Allergies Patient has no known allergies.  Family History  Problem Relation Age of Onset  . Breast cancer Maternal Aunt     Social History Social History  Substance Use Topics  . Smoking status: Current Every Day Smoker    Types: Cigarettes  . Smokeless tobacco: Never Used  . Alcohol use No    Review of  Systems Constitutional: No fever/chills.No lightheadedness or syncope. Eyes: No visual changes. ENT: No sore throat. No congestion or rhinorrhea. Cardiovascular: Denies chest pain. Denies palpitations. Respiratory: Denies shortness of breath.  No cough. Gastrointestinal: Positive left lower quadrant abdominal pain.  No nausea, no vomiting.  No diarrhea.  No constipation. Genitourinary: Negative for dysuria. Positive urinary frequency. Negative hematuria. Musculoskeletal: Negative for back pain. Positive for right shoulder pain. Negative for right elbow or wrist pain. No trauma. Skin: Negative for rash. Neurological: Negative for headaches. No focal numbness, tingling or weakness.   10-point ROS otherwise negative.  ____________________________________________   PHYSICAL EXAM:  VITAL SIGNS: ED Triage Vitals  Enc Vitals Group     BP 12/10/16 1701 (!) 141/66     Pulse Rate 12/10/16 1701 74     Resp 12/10/16 1701 16     Temp 12/10/16 1701 98.5 F (36.9 C)     Temp Source 12/10/16 1701 Oral     SpO2 12/10/16 1701 100 %     Weight 12/10/16 1701 120 lb (54.4 kg)     Height 12/10/16 1701 5' (1.524 m)     Head Circumference --      Peak Flow --      Pain Score 12/10/16 1705 8     Pain Loc --      Pain Edu? --      Excl. in GC? --     Constitutional: Alert and oriented. Well appearing and in no acute distress. Answers questions appropriately. Eyes: Conjunctivae are normal.  EOMI. No  scleral icterus. Head: Atraumatic. Nose: No congestion/rhinnorhea. Mouth/Throat: Mucous membranes are moist.  Neck: No stridor.  Supple.  No JVD. No meningismus. Cardiovascular: Normal rate, regular rhythm. No murmurs, rubs or gallops.  Respiratory: Normal respiratory effort.  No accessory muscle use or retractions. Lungs CTAB.  No wheezes, rales or ronchi. Gastrointestinal: Soft, and nondistended.  No reproducible CVA tenderness on the right or the left. Positive tenderness to palpation in the left  lower quadrant towards the midline. No guarding or rebound.  No peritoneal signs. Musculoskeletal: No LE edema. Full range of motion of the right elbow and wrist without pain. Normal right radial pulse. The patient has full range of motion with some pain with elevation of the right shoulder. She is no evidence of dislocation or humeral instability. Normal sensation to light touch throughout the right upper extremity. 5 out of 5 hip strength, biceps and triceps strength. 4+ out of 5 right deltoid strength. Neurologic:  A&Ox3.  Speech is clear.  Face and smile are symmetric.  EOMI.  Moves all extremities well. Skin:  Skin is warm, dry and intact. No rash noted. Psychiatric: Mood and affect are normal. Speech and behavior are normal.  Normal judgement.  ____________________________________________   LABS (all labs ordered are listed, but only abnormal results are displayed)  Labs Reviewed  URINALYSIS, COMPLETE (UACMP) WITH MICROSCOPIC   ____________________________________________  EKG  ED ECG REPORT Patient refused EKG.  ____________________________________________  RADIOLOGY  No results found.  ____________________________________________   PROCEDURES  Procedure(s) performed: None  Procedures  Critical Care performed: No ____________________________________________   INITIAL IMPRESSION / ASSESSMENT AND PLAN / ED COURSE  Pertinent labs & imaging results that were available during my care of the patient were reviewed by me and considered in my medical decision making (see chart for details).  66 y.o. female presenting with 2 separate complaints: Right shoulder pain for 2 weeks, and left flank, left lower quadrant pain with associated urinary frequency for several days. The patient may have a muscle, tendon or ligament injury in her right shoulder, but there is no evidence of dislocation or fracture today. I have encouraged her to take NSAID medications, and use heat or  cryotherapy. I have encouraged her to see an orthopedist for further evaluation and treatment including possible additional imaging or physical therapy recommendations. For her abdominal pain, I'll consider UTI, as well as diverticulitis. We'll get basic labs, and urinalysis. If her urine is clean, we'll proceed with a CT scan for evaluation of diverticulitis. Plan reevaluation for final disposition.  ----------------------------------------- 7:47 PM on 12/10/2016 -----------------------------------------  At this time, the patient has deferred all testing other than a urinalysis and an oral treatment for her pain. She does not want to have ordered all, or any blood work performed. She says that she will go to her primary care physician for blood work. I did tell her that if her urine was negative, there are other causes for her pain and even if her urine was positive that I would want to check her kidney function. She still does not want to have any additional testing. Plan to give her Motrin, follow-up her urine, and discharge her home.  ----------------------------------------- 8:12 PM on 12/10/2016 -----------------------------------------  The patient's urinalysis is missing and the patient is unwilling to give another sample. She stands that I'm unable to give any further elucidation about her symptoms were diagnoses today without the urinalysis, normal able to guide her future care. She is leaving AGAINST MEDICAL ADVICE. She understands  return precautions as well as follow-up instructions  ____________________________________________  FINAL CLINICAL IMPRESSION(S) / ED DIAGNOSES  Final diagnoses:  Left lower quadrant pain  Left flank pain  Acute pain of right shoulder         NEW MEDICATIONS STARTED DURING THIS VISIT:  New Prescriptions   No medications on file      Rockne Menghini, MD 12/10/16 2012

## 2016-12-10 NOTE — ED Notes (Signed)
Pt left AMA without talking to the provider or this nurse

## 2016-12-10 NOTE — ED Notes (Addendum)
Pt states she does not want the EKG of blood draw. Pt was told she was getting pills for her pain, refused shot. Dr. Sharma CovertNorman aware.

## 2016-12-10 NOTE — ED Notes (Signed)
Pt reports left side pain and right shoulder pain - the pain has been present for 2 weeks - pt denies injury

## 2017-05-15 IMAGING — CR DG CERVICAL SPINE COMPLETE 4+V
5 series · 5 of 5 positions shown · non-contrast
Comparison: None.

CLINICAL DATA: Right rib and right shoulder pain.  Recent fall.

EXAM:
CERVICAL SPINE  4+ VIEWS

[c-spine lat]
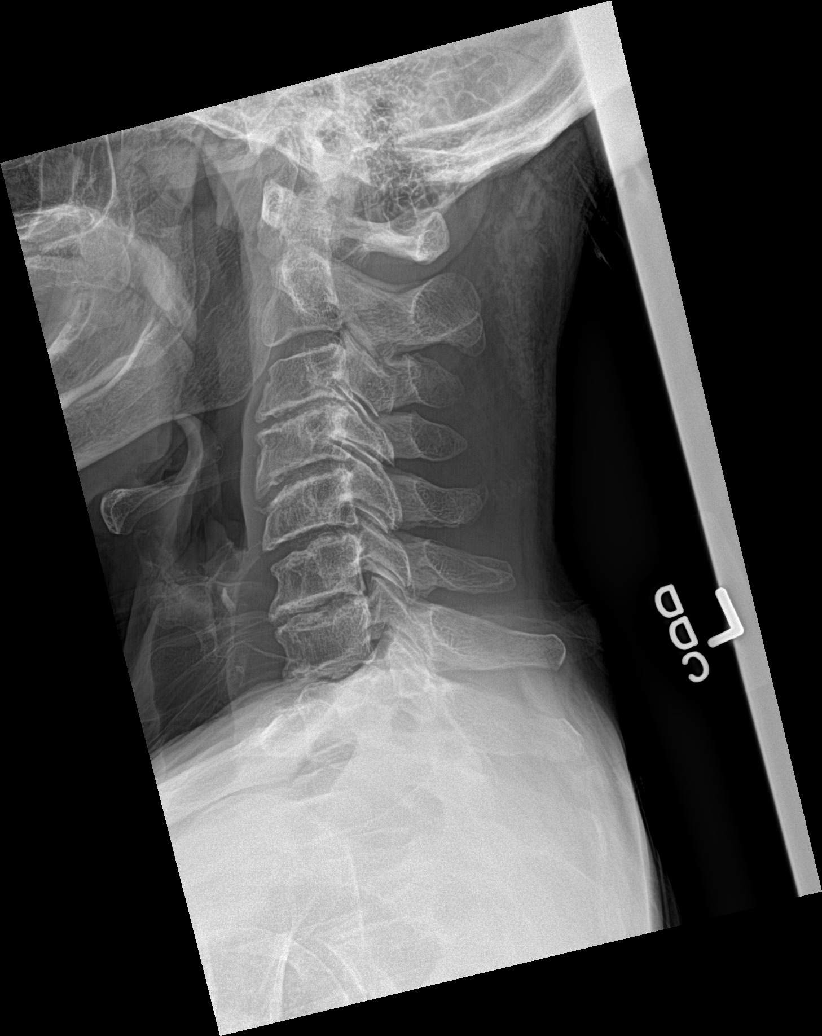

[c-spine obl (1 of 2)]
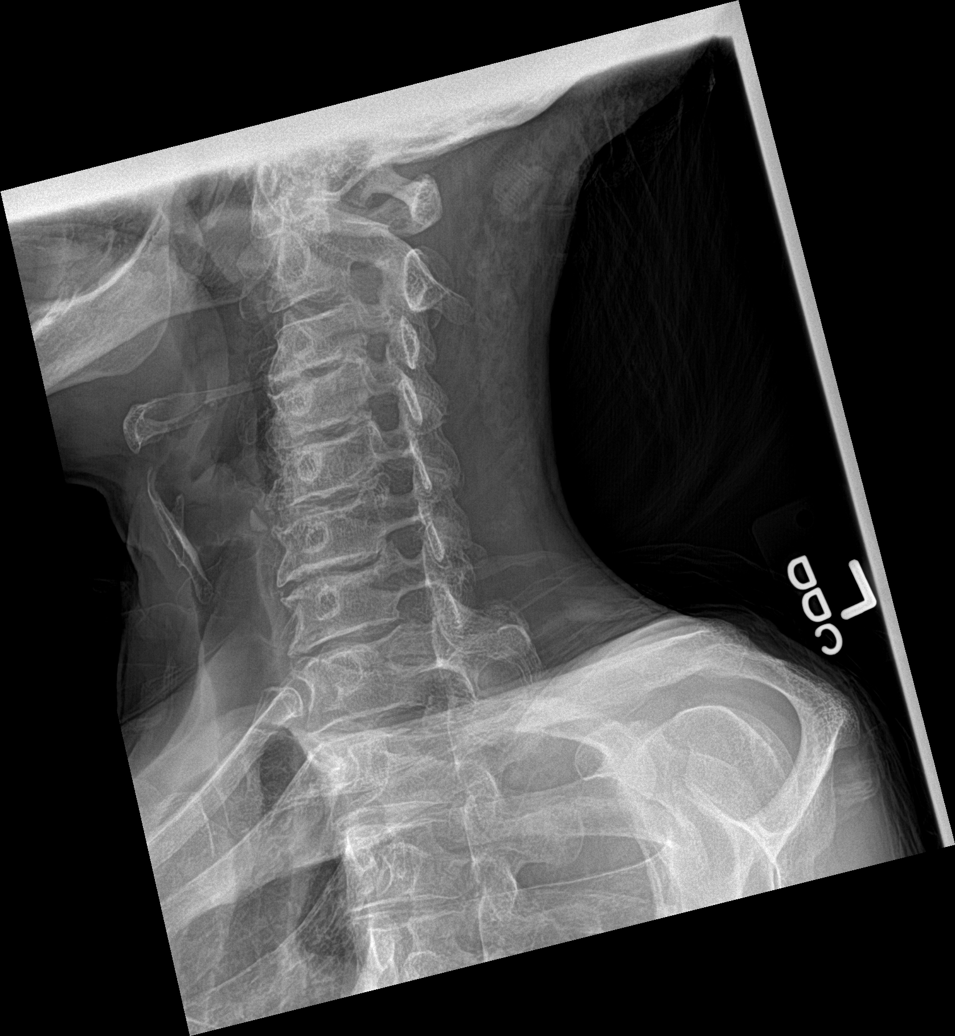

[c-spine obl (2 of 2)]
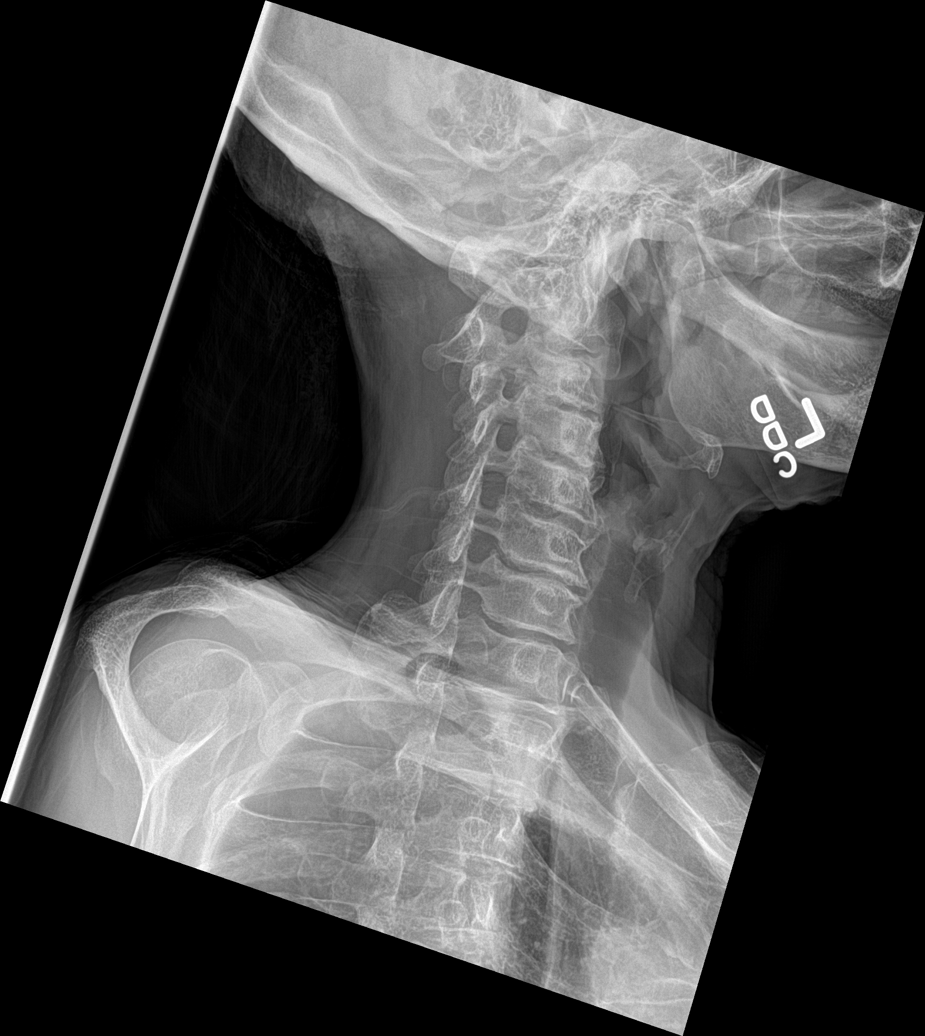

[c-spine ap]
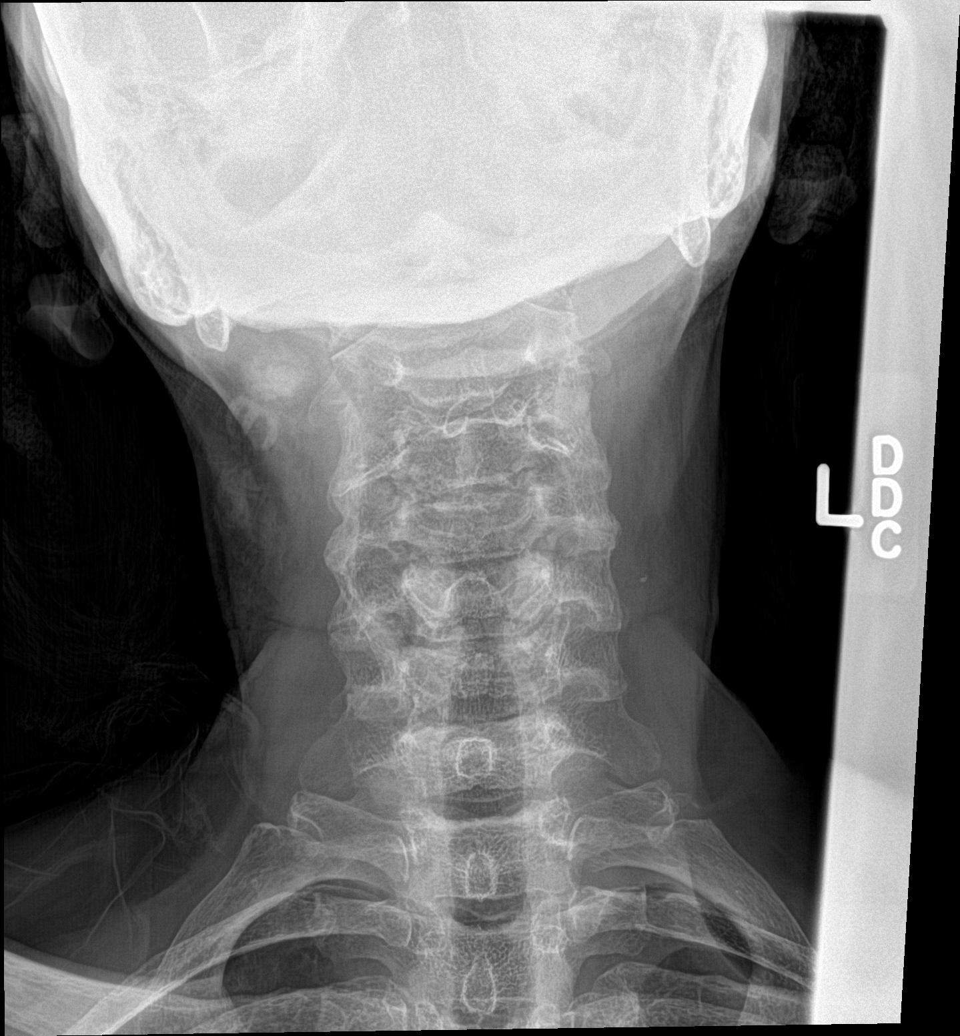

[c-spine open mouth]
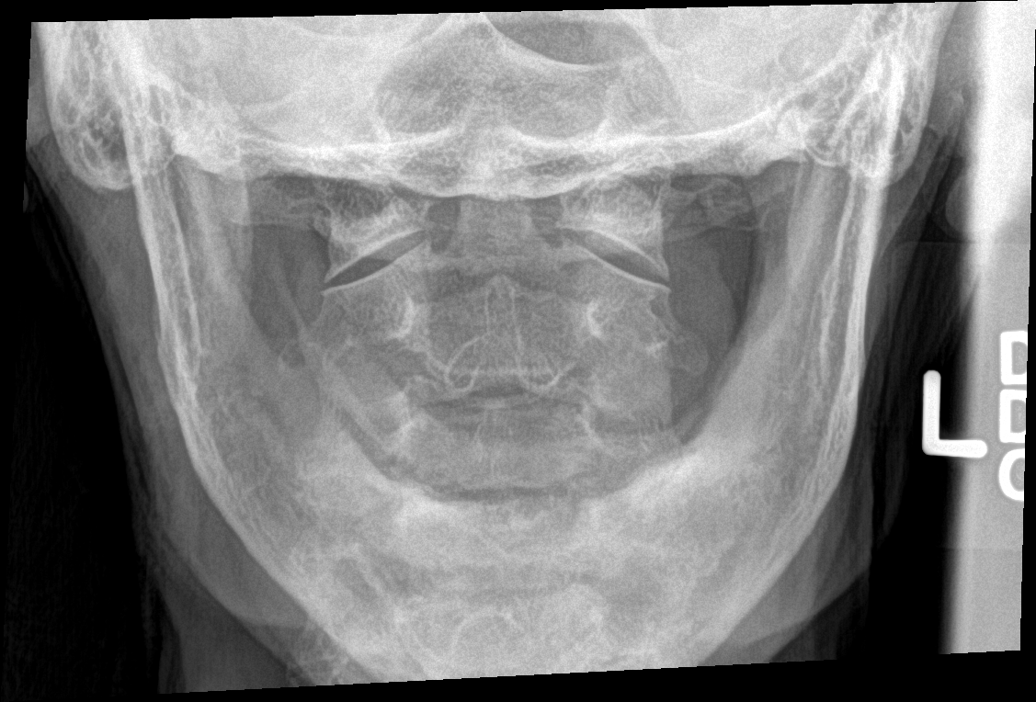

[5 of 5 positions shown; findings below may reference images not displayed]

FINDINGS: Alignment of the cervical spine is normal. Multilevel disc space
narrowing and endplate degenerative changes. The prevertebral soft
tissues are normal. Mild bony encroachment of the right foramen at
C2-C3. No acute fracture or dislocation.
IMPRESSION: No acute bone abnormality.

Multilevel degenerative disc disease in the cervical spine.

## 2017-05-15 IMAGING — CR DG SHOULDER 2+V*R*
3 series · 3 of 3 positions shown · non-contrast
Comparison: None.

CLINICAL DATA: Pt c/o right lateral rib pain and right shoulder
pain, worse since fall on [REDACTED]

EXAM:
RIGHT SHOULDER - 2+ VIEW

[shoulder grashey]
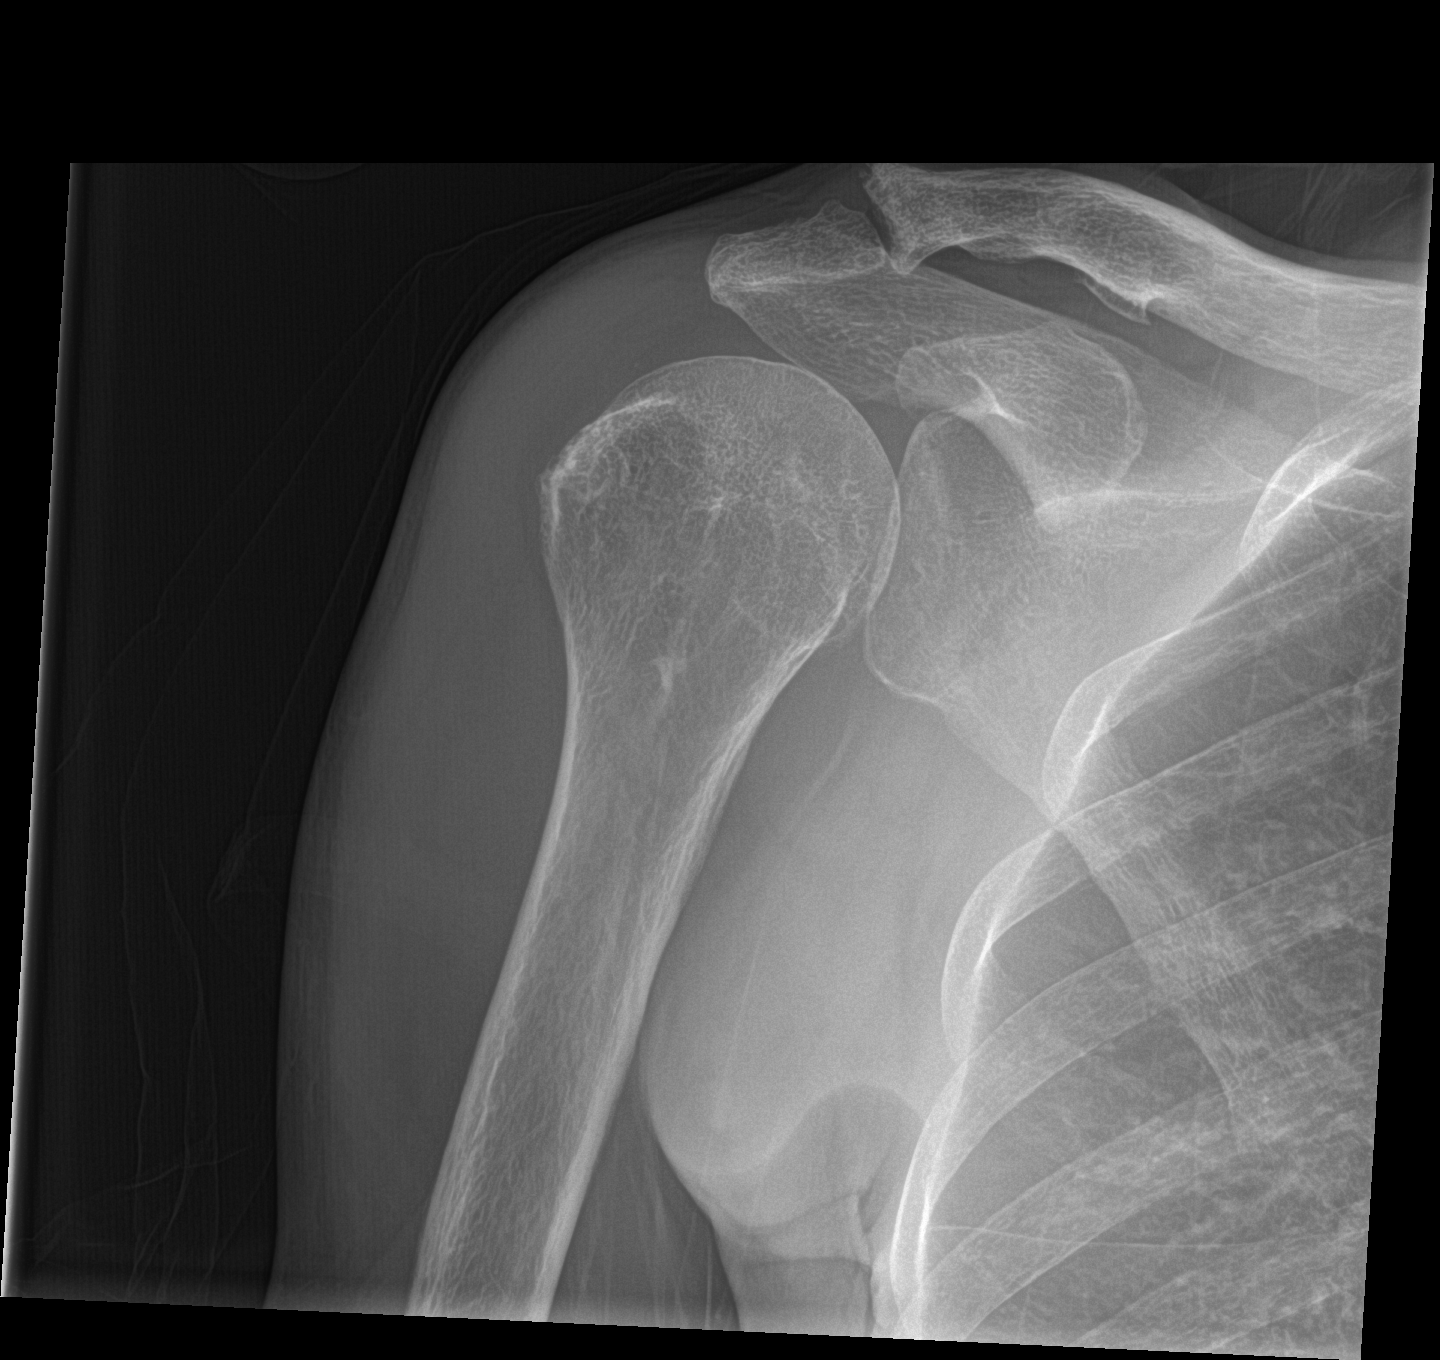

[shoulder y view]
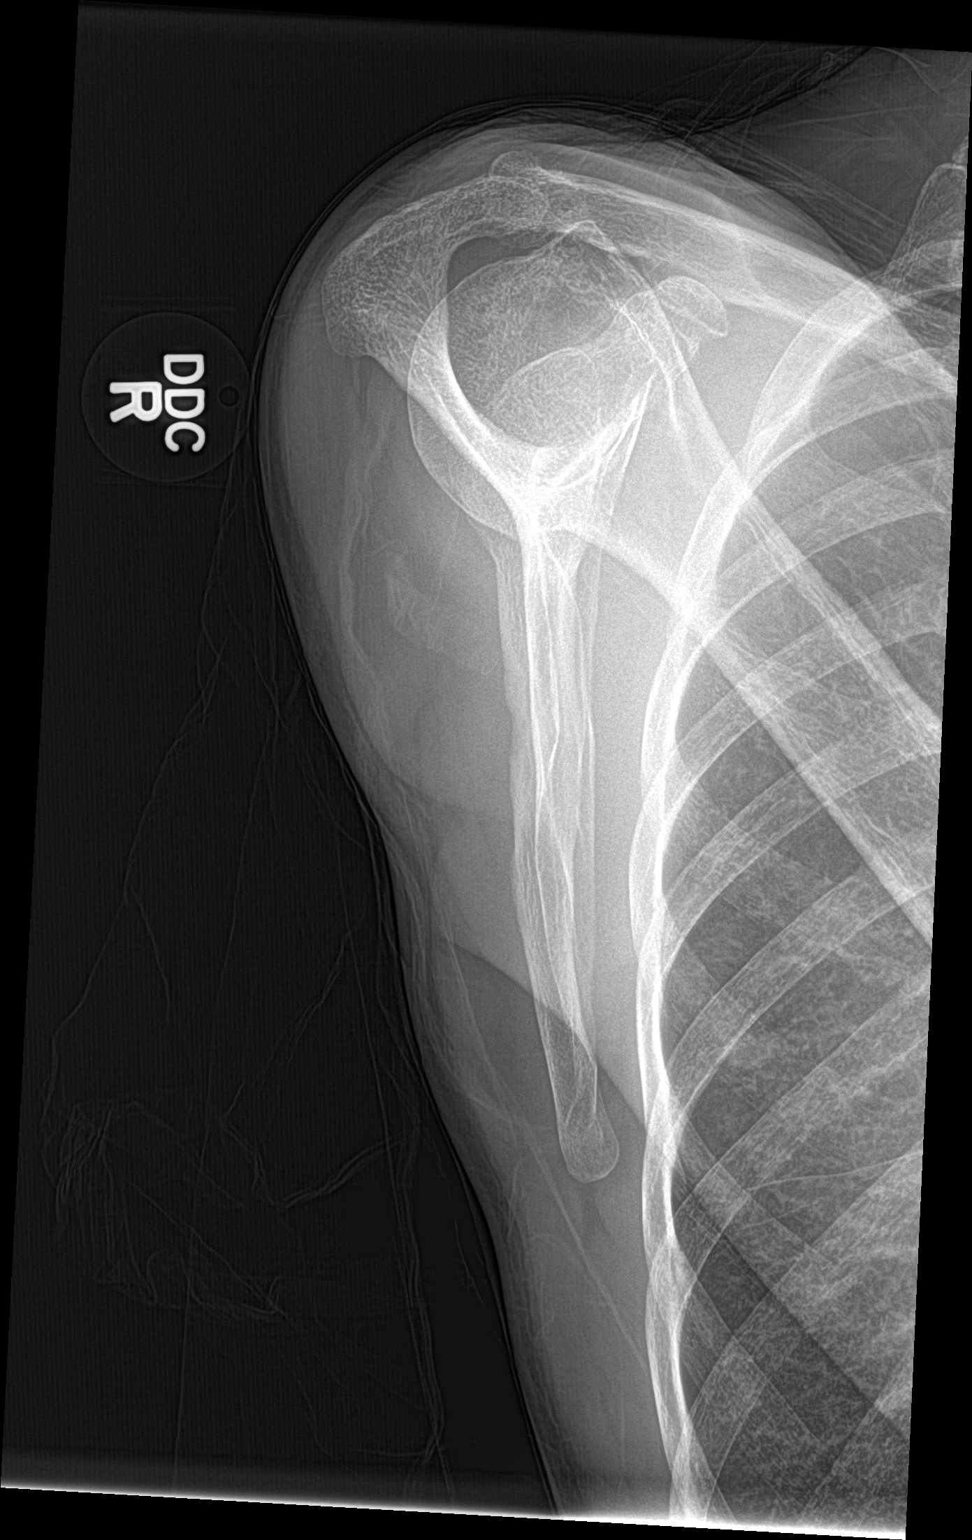

[shoulder axillary]
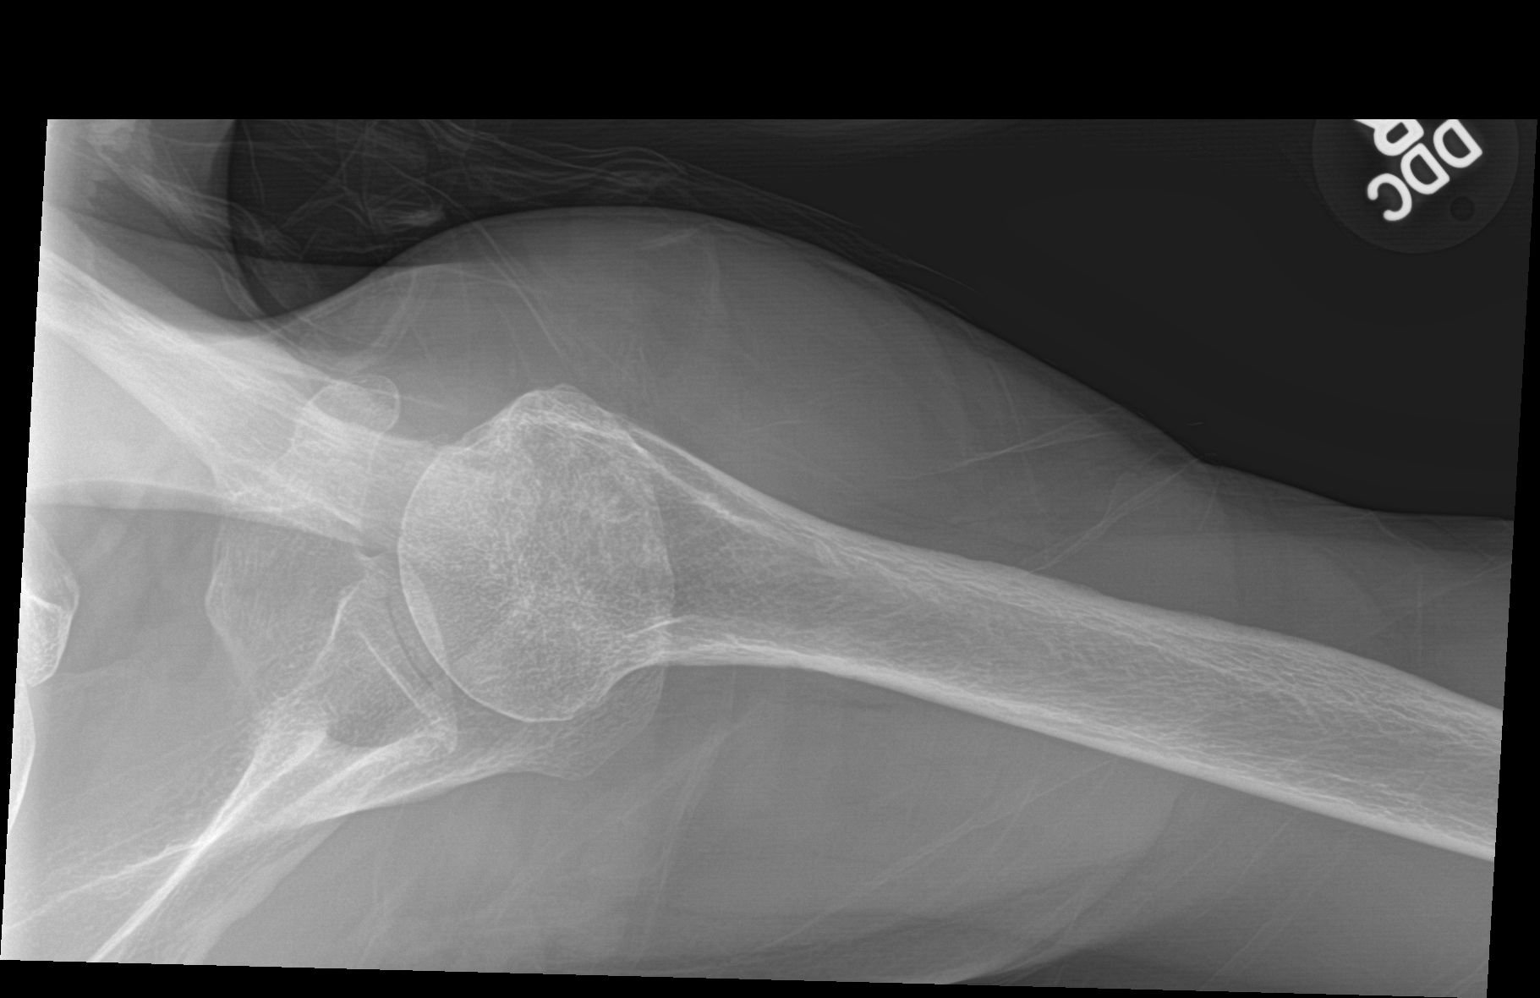

[3 of 3 positions shown; findings below may reference images not displayed]

FINDINGS: No fracture. No bone lesion. Mild AC joint osteoarthritis.
Glenohumeral joint is well preserved.

Bones are demineralized.  Soft tissues are unremarkable.
IMPRESSION: No fracture dislocation.  Mild AC joint osteoarthritis.

## 2017-06-11 ENCOUNTER — Encounter: Payer: Self-pay | Admitting: Emergency Medicine

## 2017-06-11 ENCOUNTER — Emergency Department
Admission: EM | Admit: 2017-06-11 | Discharge: 2017-06-11 | Disposition: A | Discharge status: Home / Self Care | Payer: Self-pay | Attending: Emergency Medicine | Admitting: Emergency Medicine

## 2017-06-11 DIAGNOSIS — M25511 Pain in right shoulder: Secondary | ICD-10-CM

## 2017-06-11 DIAGNOSIS — Z79899 Other long term (current) drug therapy: Secondary | ICD-10-CM | POA: Insufficient documentation

## 2017-06-11 DIAGNOSIS — F17210 Nicotine dependence, cigarettes, uncomplicated: Secondary | ICD-10-CM | POA: Insufficient documentation

## 2017-06-11 DIAGNOSIS — M7551 Bursitis of right shoulder: Secondary | ICD-10-CM | POA: Insufficient documentation

## 2017-06-11 MED ORDER — NAPROXEN 500 MG PO TABS
500.0000 mg | ORAL_TABLET | Freq: Once | ORAL | Status: AC
Start: 2017-06-11 — End: 2017-06-11
  Administered 2017-06-11: 500 mg via ORAL
  Filled 2017-06-11: qty 1

## 2017-06-11 MED ORDER — NAPROXEN 500 MG PO TABS: 500.0000 mg | ORAL_TABLET | Freq: Two times a day (BID) | ORAL | 0 refills | Status: DC

## 2017-06-11 NOTE — ED Notes (Signed)
,  Patient verbalizes understanding of d/c instructions and follow-up. VS stable and pain controlled per patient.  Patient in NAD at time of d/c and denies further concerns regarding this visit. Patient stable at the time of departure from the unit, departing unit by the safest and most appropriate manner per that patients condition and limitations. Patient advised to return to the ED at any time for emergent concerns, or for new/worsening symptoms.   Pt refused wheel chair. Going to husbands room

## 2017-06-11 NOTE — ED Provider Notes (Signed)
[facility] Emergency Department Provider Note   ____________________________________________   First MD Initiated Contact with Patient 06/11/17 0422     (approximate)  I have reviewed the triage vital signs and the nursing notes.   HISTORY  Chief Complaint Shoulder Pain    HPI Kimberly Berger is a 66 y.o. female who presents to the ED from home with a chief complaint of nontraumatic right shoulder ain. Patient is here with her husband who is a patient and decided to be evaluated for her right shoulder pain which has been bothering her for the past 3 weeks. 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 She is right-hand dominant. Complains of pain with movement to her right shoulder without associated extremity weakness, numbness or tingling. Denies recent fever, chills, neck pain, chest pain, shortness of breath, nausea, vomiting. Denies recent travel or trauma. Has not tried anything for her pain.   Past medical history None  There are no active problems to display for this patient.   History reviewed. No pertinent surgical history.  Prior to Admission medications   Medication Sig Start Date End Date Taking? Authorizing Provider  cyclobenzaprine (FLEXERIL) 5 MG tablet Take 1-2 tablets (5-10 mg total) by mouth 3 (three) times daily as needed for muscle spasms. 08/30/16   Evon Slack, PA-C  esomeprazole (NEXIUM) 40 MG capsule Take 1 capsule (40 mg total) by mouth daily. 07/06/16 07/06/17  Charlynne Pander, MD  famotidine (PEPCID) 20 MG tablet Take 1 tablet (20 mg total) by mouth 2 (two) times daily as needed for heartburn or indigestion. 07/06/16 07/06/17  Charlynne Pander, MD  ibuprofen (ADVIL,MOTRIN) 600 MG tablet Take 1 tablet (600 mg total) by mouth every 8 (eight) hours as needed for moderate pain (with food). 08/04/15   Rockne Menghini, MD  predniSONE (DELTASONE) 10 MG tablet Take 1 tablet  (10 mg total) by mouth daily. 6,5,4,3,2,1 six day taper 08/30/16   Evon Slack, PA-C    Allergies Patient has no known allergies.  Family History  Problem Relation Age of Onset  . Breast cancer Maternal Aunt     Social History Social History  Substance Use Topics  . Smoking status: Current Every Day Smoker    Types: Cigarettes  . Smokeless tobacco: Never Used  . Alcohol use No    Review of Systems  Constitutional: No fever/chills. Eyes: No visual changes. ENT: No sore throat. Cardiovascular: Denies chest pain. Respiratory: Denies shortness of breath. Gastrointestinal: No abdominal pain.  No nausea, no vomiting.  No diarrhea.  No constipation. Genitourinary: Negative for dysuria. Musculoskeletal: positive for right shoulder pain. Negative for back pain. Skin: Negative for rash. Neurological: Negative for headaches, focal weakness or numbness.   ____________________________________________   PHYSICAL EXAM:  VITAL SIGNS: ED Triage Vitals  Enc Vitals Group     BP 06/11/17 0048 (!) 143/61     Pulse Rate 06/11/17 0048 80     Resp 06/11/17 0048 18     Temp 06/11/17 0048 97.9 F (36.6 C)     Temp Source 06/11/17 0048 Oral     SpO2 06/11/17 0048 99 %     Weight 06/11/17 0047 118 lb (53.5 kg)     Height 06/11/17 0047 5\' 4"  (1.626 m)     Head Circumference --      Peak Flow --      Pain Score 06/11/17 0047 8     Pain  Loc --      Pain Edu? --      Excl. in GC? --     Constitutional: Alert and oriented. Well appearing and in no acute distress. Eyes: Conjunctivae are normal. PERRL. EOMI. Head: Atraumatic. Nose: No congestion/rhinnorhea. Mouth/Throat: Mucous membranes are moist.  Oropharynx non-erythematous. Neck: No stridor.  No cervical spine tenderness to palpation. Cardiovascular: Normal rate, regular rhythm. Grossly normal heart sounds.  Good peripheral circulation. Respiratory: Normal respiratory effort.  No retractions. Lungs CTAB. Gastrointestinal:  Soft and nontender. No distention. No abdominal bruits. No CVA tenderness. Musculoskeletal: right shoulder tender to palpation. no warmth or erythema overlying the joint to suggest septic joint. Full range of motion with minimal pain. 2+ radial pulses. Brisk, less than 5 second capillary refill. Full strength and sensation. Neurologic:  Normal speech and language. No gross focal neurologic deficits are appreciated. No gait instability. Skin:  Skin is warm, dry and intact. No rash noted. Psychiatric: Mood and affect are normal. Speech and behavior are normal.  ____________________________________________   LABS (all labs ordered are listed, but only abnormal results are displayed)  Labs Reviewed - No data to display ____________________________________________  EKG  None ____________________________________________  RADIOLOGY  No results found.  ____________________________________________   PROCEDURES  Procedure(s) performed: None  Procedures  Critical Care performed: No  ____________________________________________   INITIAL IMPRESSION / ASSESSMENT AND PLAN / ED COURSE  Pertinent labs & imaging results that were available during my care of the patient were reviewed by me and considered in my medical decision making (see chart for details).  66 year old female who presents with a three-week history of right shoulder pain worsened with movements. Advised NSAIDs, sling and follow-up with orthopedics. Patient declined sling. Strict return precautions given. Patient verbalizes understanding and agrees with plan of care.      ____________________________________________   FINAL CLINICAL IMPRESSION(S) / ED DIAGNOSES  Final diagnoses:  Acute pain of right shoulder  Bursitis of right shoulder      NEW MEDICATIONS STARTED DURING THIS VISIT:  New Prescriptions   No medications on file     Note:  This document was prepared using Dragon voice recognition software  and may include unintentional dictation errors.    Irean Hong, MD 06/11/17 [phone]

## 2017-06-11 NOTE — ED Notes (Signed)
Patient states her right shoulder is hurting. 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 Upon entering room patient was laying on stretcher with the affected arm behind her head with weight on it. Patient states the pain is worse with movement. Also she has a bug bite to that arm that is itching.

## 2017-06-11 NOTE — Discharge Instructions (Signed)
You may take Naprosyn twice daily as needed for pain (#10). Return to the ER for worsening symptoms, persistent vomiting, difficulty er concerns.

## 2017-06-11 NOTE — ED Triage Notes (Signed)
Patient ambulatory to triage with steady gait, without difficulty or distress noted; pt reports right shoulder pain with movement x 3wks; denies any specific injury but reports that she has "arthritis"; brought her husband in to be seen and wanted to be checked out too while she was here

## 2017-07-01 ENCOUNTER — Ambulatory Visit: Payer: Self-pay

## 2017-07-29 ENCOUNTER — Ambulatory Visit
Admission: RE | Admit: 2017-07-29 | Discharge: 2017-07-29 | Disposition: A | Discharge status: Home / Self Care | Payer: Self-pay | Source: Ambulatory Visit | Attending: Oncology | Admitting: Oncology

## 2017-07-29 ENCOUNTER — Ambulatory Visit: Payer: Self-pay | Attending: Oncology

## 2017-07-29 VITALS — BP 171/83 | HR 76 | Temp 98.0°F | Ht 65.0 in | Wt 110.0 lb

## 2017-07-29 DIAGNOSIS — Z0000 Encounter for general adult medical examination without abnormal findings: Secondary | ICD-10-CM

## 2017-07-29 NOTE — Progress Notes (Signed)
Subjective:     Patient ID: Kimberly Berger, female   DOB: [birth year], 66 y.o.   MRN: [id number]  HPI   Review of Systems     Objective:   Physical Exam  Pulmonary/Chest: Right breast exhibits no inverted nipple, no mass, no nipple discharge, no skin change and no tenderness. Left breast exhibits no inverted nipple, no mass, no nipple discharge, no skin change and no tenderness. Breasts are symmetrical.       Assessment:     66 year old patient presents for [facility] clinic visit.  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 Patient screened, and meets BCCCP eligibility.  Patient does not have insurance, Medicare or Medicaid.  Handout given on Affordable Care Act. Instructed patient on breast self-exam using teach back method.  CBE unremarkable.  No mass or lump palpated.  Patient is to monitor blood pressure daily, and follow-up with primary physician if remains elevated.  Discussed health issues related to sustained hypertension.    Plan:     Sent for bilateral screening mammogram.

## 2017-07-30 NOTE — Progress Notes (Signed)
Letter mailed from [facility] to notify of normal mammogram results.  Patient to return in one year for annual screening.  Copy to [facility]. 

## 2017-09-12 ENCOUNTER — Other Ambulatory Visit: Payer: Self-pay | Admitting: Nurse Practitioner

## 2017-09-12 DIAGNOSIS — Z1239 Encounter for other screening for malignant neoplasm of breast: Secondary | ICD-10-CM

## 2018-02-03 ENCOUNTER — Encounter: Payer: Self-pay | Admitting: Emergency Medicine

## 2018-02-03 ENCOUNTER — Emergency Department
Admission: EM | Admit: 2018-02-03 | Discharge: 2018-02-03 | Disposition: A | Discharge status: Home / Self Care | Payer: Self-pay | Attending: Student in an Organized Health Care Education/Training Program | Admitting: Student in an Organized Health Care Education/Training Program

## 2018-02-03 ENCOUNTER — Emergency Department: Payer: Self-pay

## 2018-02-03 ENCOUNTER — Other Ambulatory Visit: Payer: Self-pay

## 2018-02-03 DIAGNOSIS — R0789 Other chest pain: Secondary | ICD-10-CM | POA: Insufficient documentation

## 2018-02-03 DIAGNOSIS — F17210 Nicotine dependence, cigarettes, uncomplicated: Secondary | ICD-10-CM | POA: Insufficient documentation

## 2018-02-03 LAB — BASIC METABOLIC PANEL
ANION GAP: 7 (ref 5–15)
BUN: 11 mg/dL (ref 6–20)
CALCIUM: 8.9 mg/dL (ref 8.9–10.3)
CO2: 31 mmol/L (ref 22–32)
Chloride: 103 mmol/L (ref 101–111)
Creatinine, Ser: 0.68 mg/dL (ref 0.44–1.00)
Glucose, Bld: 93 mg/dL (ref 65–99)
Potassium: 3.6 mmol/L (ref 3.5–5.1)
Sodium: 141 mmol/L (ref 135–145)

## 2018-02-03 LAB — CBC
HCT: 37.7 % (ref 35.0–47.0)
HEMOGLOBIN: 13.1 g/dL (ref 12.0–16.0)
MCH: 33.2 pg (ref 26.0–34.0)
MCHC: 34.6 g/dL (ref 32.0–36.0)
MCV: 95.8 fL (ref 80.0–100.0)
Platelets: 312 10*3/uL (ref 150–440)
RBC: 3.94 MIL/uL (ref 3.80–5.20)
RDW: 13.2 % (ref 11.5–14.5)
WBC: 5.8 10*3/uL (ref 3.6–11.0)

## 2018-02-03 LAB — TROPONIN I

## 2018-02-03 MED ORDER — HYDROCODONE-ACETAMINOPHEN 5-325 MG PO TABS: 1.0000 | ORAL_TABLET | ORAL | 0 refills | Status: DC | PRN

## 2018-02-03 MED ORDER — CYCLOBENZAPRINE HCL 5 MG PO TABS: 5.0000 mg | ORAL_TABLET | Freq: Three times a day (TID) | ORAL | 0 refills | Status: DC | PRN

## 2018-02-03 NOTE — ED Provider Notes (Signed)
[facility] Emergency Department Provider Note    First MD Initiated Contact with Patient 02/03/18 1437     (approximate)  I have reviewed the triage vital signs and the nursing notes.   HISTORY  Chief Complaint Chest Pain    HPI Kimberly Berger is a 67 y.o. female presents with chief complaint of left anterior chest wall pain that occurred after the patient fell against her stair banister roughly 2 days ago.  States that she simply tripped and missed a step.  States that she has been trying to deal with this and treat with this with Motrin at home but is having progressively worsening pain when taking deep inspiration.  Denies any shortness of breath.  No fevers.  No lower extremity swelling.  Denies any history of heart failure.  History reviewed. No pertinent past medical history. Family History  Problem Relation Age of Onset  . Breast cancer Maternal Aunt    History reviewed. No pertinent surgical history. There are no active problems to display for this patient.     Prior to Admission medications   Medication Sig Start Date End Date Taking? Authorizing Provider  cyclobenzaprine (FLEXERIL) 5 MG tablet Take 1-2 tablets (5-10 mg total) by mouth 3 (three) times daily as needed for muscle spasms. 08/30/16   Evon SlackGaines, Thomas C, PA-C  cyclobenzaprine (FLEXERIL) 5 MG tablet Take 1 tablet (5 mg total) by mouth 3 (three) times daily as needed for muscle spasms. 02/03/18   Willy Eddyobinson, Arturo Sofranko, MD  esomeprazole (NEXIUM) 40 MG capsule Take 1 capsule (40 mg total) by mouth daily. 07/06/16 07/06/17  Charlynne PanderYao, David Hsienta, MD  famotidine (PEPCID) 20 MG tablet Take 1 tablet (20 mg total) by mouth 2 (two) times daily as needed for heartburn or indigestion. 07/06/16 07/06/17  Charlynne PanderYao, David Hsienta, MD  HYDROcodone-acetaminophen (NORCO) 5-325 MG tablet Take 1 tablet by mouth every 4 (four) hours as needed for moderate pain. 02/03/18   Willy Eddyobinson, Marquin Patino, MD  ibuprofen (ADVIL,MOTRIN) 600  MG tablet Take 1 tablet (600 mg total) by mouth every 8 (eight) hours as needed for moderate pain (with food). 08/04/15   Rockne MenghiniNorman, Anne-Caroline, MD  naproxen (NAPROSYN) 500 MG tablet Take 1 tablet (500 mg total) by mouth 2 (two) times daily with a meal. 06/11/17   Irean HongSung, Jade J, MD  predniSONE (DELTASONE) 10 MG tablet Take 1 tablet (10 mg total) by mouth daily. 6,5,4,3,2,1 six day taper 08/30/16   Evon SlackGaines, Thomas C, PA-C    Allergies Patient has no known allergies.    Social History Social History   Tobacco Use  . Smoking status: Current Every Day Smoker    Types: Cigarettes  . Smokeless tobacco: Never Used  Substance Use Topics  . Alcohol use: No  . Drug use: No    Review of Systems Patient denies headaches, rhinorrhea, blurry vision, numbness, shortness of breath, chest pain, edema, cough, abdominal pain, nausea, vomiting, diarrhea, dysuria, fevers, rashes or hallucinations unless otherwise stated above in HPI. ____________________________________________   PHYSICAL EXAM:  VITAL SIGNS: Vitals:   02/03/18 1248  BP: (!) 172/62  Pulse: 89  Resp: 18  Temp: 98.2 F (36.8 C)  SpO2: 98%    Constitutional: Alert and oriented. Well appearing and in no acute distress. Eyes: Conjunctivae are normal.  Head: Atraumatic. Nose: No congestion/rhinnorhea. Mouth/Throat: Mucous membranes are moist.   Neck: No stridor. Painless ROM.  Cardiovascular: Normal rate, regular rhythm. Grossly normal heart sounds.  Good peripheral circulation. Respiratory: Normal respiratory effort.  No retractions. Lungs CTAB. Gastrointestinal: Soft and nontender. No distention. No abdominal bruits. No CVA tenderness. Genitourinary:  Musculoskeletal: No lower extremity tenderness nor edema.  No joint effusions. Neurologic:  Normal speech and language. No gross focal neurologic deficits are appreciated. No facial droop Skin:  Skin is warm, dry and intact. No rash noted. Psychiatric: Mood and affect are  normal. Speech and behavior are normal.  ____________________________________________   LABS (all labs ordered are listed, but only abnormal results are displayed)  Results for orders placed or performed during the hospital encounter of 02/03/18 (from the past 24 hour(s))  Basic metabolic panel     Status: None   Collection Time: 02/03/18  1:00 PM  Result Value Ref Range   Sodium 141 135 - 145 mmol/L   Potassium 3.6 3.5 - 5.1 mmol/L   Chloride 103 101 - 111 mmol/L   CO2 31 22 - 32 mmol/L   Glucose, Bld 93 65 - 99 mg/dL   BUN 11 6 - 20 mg/dL   Creatinine, Ser 1.61 0.44 - 1.00 mg/dL   Calcium 8.9 8.9 - 09.6 mg/dL   GFR calc non Af Amer >60 >60 mL/min   GFR calc Af Amer >60 >60 mL/min   Anion gap 7 5 - 15  CBC     Status: None   Collection Time: 02/03/18  1:00 PM  Result Value Ref Range   WBC 5.8 3.6 - 11.0 K/uL   RBC 3.94 3.80 - 5.20 MIL/uL   Hemoglobin 13.1 12.0 - 16.0 g/dL   HCT 04.5 40.9 - 81.1 %   MCV 95.8 80.0 - 100.0 fL   MCH 33.2 26.0 - 34.0 pg   MCHC 34.6 32.0 - 36.0 g/dL   RDW 91.4 78.2 - 95.6 %   Platelets 312 150 - 440 K/uL  Troponin I     Status: None   Collection Time: 02/03/18  1:00 PM  Result Value Ref Range   Troponin I <0.03 <0.03 ng/mL   ____________________________________________  EKG My review and personal interpretation at Time: 12:53   Indication: chest pain  Rate: 80  Rhythm: swinus Axis: normal Other: normal intervals, no stemi ____________________________________________  RADIOLOGY  I personally reviewed all radiographic images ordered to evaluate for the above acute complaints and reviewed radiology reports and findings.  These findings were personally discussed with the patient.  Please see medical record for radiology report.]  ____________________________________________   PROCEDURES  Procedure(s) performed:  Procedures    Critical Care performed: no ____________________________________________   INITIAL IMPRESSION /  ASSESSMENT AND PLAN / ED COURSE  Pertinent labs & imaging results that were available during my care of the patient were reviewed by me and considered in my medical decision making (see chart for details).  DDX: Rib fracture, contusion, pleuritis, ACS, dissection, PE, pneumonia  Kimberly Berger is a 67 y.o. who presents to the ED with symptoms as described above.  Not clinically consistent with ACS, dissection or PE.  Musculoskeletal in nature.  No evidence of displaced rib fracture on plain film.  Blood work otherwise reassuring.  Patient stable and appropriate for outpatient management with oral pain medication.  Have discussed with the patient and available family all diagnostics and treatments performed thus far and all questions were answered to the best of my ability. The patient demonstrates understanding and agreement with plan.       As part of my medical decision making, I reviewed the following data within the electronic MEDICAL RECORD NUMBER Nursing  notes reviewed and incorporated, Labs reviewed, notes from prior ED visits and [place] Controlled Substance Database   ____________________________________________   FINAL CLINICAL IMPRESSION(S) / ED DIAGNOSES  Final diagnoses:  Chest wall pain      NEW MEDICATIONS STARTED DURING THIS VISIT:  New Prescriptions   CYCLOBENZAPRINE (FLEXERIL) 5 MG TABLET    Take 1 tablet (5 mg total) by mouth 3 (three) times daily as needed for muscle spasms.   HYDROCODONE-ACETAMINOPHEN (NORCO) 5-325 MG TABLET    Take 1 tablet by mouth every 4 (four) hours as needed for moderate pain.     Note:  This document was prepared using Dragon voice recognition software and may include unintentional dictation errors.    Willy Eddy, MD 02/03/18 1446

## 2018-02-03 NOTE — ED Triage Notes (Signed)
Pt presents to ED c/o L-sided chest/rib cage pain. Reports mechanical fall 2 days ago and landed on L ribs while going up steps. Pain is 5/10, non-radiating, worse with inspiration.

## 2018-04-30 ENCOUNTER — Encounter: Payer: Self-pay | Admitting: Obstetrics and Gynecology

## 2018-05-07 ENCOUNTER — Encounter: Payer: Self-pay | Admitting: Obstetrics and Gynecology

## 2018-05-07 ENCOUNTER — Ambulatory Visit (INDEPENDENT_AMBULATORY_CARE_PROVIDER_SITE_OTHER): Payer: Medicare HMO | Admitting: Obstetrics and Gynecology

## 2018-05-07 VITALS — BP 145/72 | HR 85 | Ht 65.0 in | Wt 102.8 lb

## 2018-05-07 DIAGNOSIS — N812 Incomplete uterovaginal prolapse: Secondary | ICD-10-CM | POA: Diagnosis not present

## 2018-05-07 DIAGNOSIS — Z4689 Encounter for fitting and adjustment of other specified devices: Secondary | ICD-10-CM

## 2018-05-07 NOTE — Progress Notes (Signed)
Pt is present today for pessary fitting. Pt stated that she is doing okay.

## 2018-05-07 NOTE — Progress Notes (Signed)
Subjective:    Kimberly Berger is a 67 y.o. [id number] female who presents for evaluation of a pelvic organ prolapse (uterine prolapse). She was seen ~ 2 years for similar complaints, and discussion was had on surgical (hysterectomy with anterior repair as she was also noted to have a mild cystocele) vs non-surgical management (pessary). Patient reports that at that time she got scared regarding those options and had no further follow up. Symptoms include: prolapse of tissue with straining and discomfort: moderate. Also reports that the urine stream is not straight anymore but veers to one side or another. Symptoms have gradually worsened.  Is now ready to proceed with pessary trial.   Menstrual History: OB History    Gravida  3   Para      Term      Preterm      AB      Living        SAB      TAB      Ectopic      Multiple      Live Births              Menarche age: 72 No LMP recorded. Patient is postmenopausal.     History reviewed. No pertinent past medical history.  Family History  Problem Relation Age of Onset  . Hypertension Father   . Breast cancer Maternal Aunt     History reviewed. No pertinent surgical history.  Social History   Socioeconomic History  . Marital status: Married    Spouse name: Not on file  . Number of children: Not on file  . Years of education: Not on file  . Highest education level: Not on file  Occupational History  . Not on file  Social Needs  . Financial resource strain: Not on file  . Food insecurity:    Worry: Not on file    Inability: Not on file  . Transportation needs:    Medical: Not on file    Non-medical: Not on file  Tobacco Use  . Smoking status: Current Every Day Smoker    Types: Cigarettes  . Smokeless tobacco: Never Used  Substance and Sexual Activity  . Alcohol use: No  . Drug use: No  . Sexual activity: Not Currently  Lifestyle  . Physical activity:    Days per week: Not on file    Minutes per  session: Not on file  . Stress: Not on file  Relationships  . Social connections:    Talks on phone: Not on file    Gets together: Not on file    Attends religious service: Not on file    Active member of club or organization: Not on file    Attends meetings of clubs or organizations: Not on file    Relationship status: Not on file  . Intimate partner violence:    Fear of current or ex partner: Not on file    Emotionally abused: Not on file    Physically abused: Not on file    Forced sexual activity: Not on file  Other Topics Concern  . Not on file  Social History Narrative  . Not on file    Current Outpatient Medications on File Prior to Visit  Medication Sig Dispense Refill  . cyclobenzaprine (FLEXERIL) 5 MG tablet Take 1-2 tablets (5-10 mg total) by mouth 3 (three) times daily as needed for muscle spasms. 20 tablet 0  . ibuprofen (ADVIL,MOTRIN) 600 MG tablet Take  1 tablet (600 mg total) by mouth every 8 (eight) hours as needed for moderate pain (with food). 15 tablet 0  . naproxen (NAPROSYN) 500 MG tablet Take 1 tablet (500 mg total) by mouth 2 (two) times daily with a meal. 10 tablet 0  . cyclobenzaprine (FLEXERIL) 5 MG tablet Take 1 tablet (5 mg total) by mouth 3 (three) times daily as needed for muscle spasms. (Patient not taking: Reported on 05/07/2018) 12 tablet 0  . esomeprazole (NEXIUM) 40 MG capsule Take 1 capsule (40 mg total) by mouth daily. 30 capsule 0  . famotidine (PEPCID) 20 MG tablet Take 1 tablet (20 mg total) by mouth 2 (two) times daily as needed for heartburn or indigestion. 30 tablet 0  . HYDROcodone-acetaminophen (NORCO) 5-325 MG tablet Take 1 tablet by mouth every 4 (four) hours as needed for moderate pain. (Patient not taking: Reported on 05/07/2018) 6 tablet 0  . predniSONE (DELTASONE) 10 MG tablet Take 1 tablet (10 mg total) by mouth daily. 6,5,4,3,2,1 six day taper (Patient not taking: Reported on 05/07/2018) 21 tablet 0   No current facility-administered  medications on file prior to visit.     No Known Allergies   Review of Systems Pertinent items noted in HPI and remainder of comprehensive ROS otherwise negative.   Objective:     BP (!) 145/72   Pulse 85   Ht 5\' 5"  (1.651 m)   Wt 102 lb 12.8 oz (46.6 kg)   BMI 17.11 kg/m  General appearance: alert and no distress Abdomen: soft, non-tender; bowel sounds normal; no masses,  no organomegaly Pelvic: external genitalia normal, rectovaginal septum normal.  Vagina without discharge, no lesions.  Cervix normal appearing, no lesions and no motion tenderness. Cervix partially protruding through hymenal ring on Valsalva noting uterine descent (Grade 3 uterine prolapse) with Grade 1-2 cystocele. No rectocele present.  Uterus mobile, nontender, normal shape and size (~ 5-6 cm).  Adnexae non-palpable, nontender bilaterally.  Extremities: extremities normal, atraumatic, no cyanosis or edema Neurologic: Grossly normal   Assessment:    The patient has a cystocele (Grade 1-2) and uterine prolapse (Grade 3)    Pessary fitting  Plan:    Discussed pelvic organ prolapse and reiterated management options with the patient. All questions answered. Pessary fitting performed today.  Will order Size 2 pessary ring with support   Follow up in 2 weeks for pessary insertion.     Hildred Laserherry, Maryjane Benedict, MD [facility]

## 2018-05-28 ENCOUNTER — Encounter: Payer: Medicare HMO | Admitting: Obstetrics and Gynecology

## 2018-06-03 DIAGNOSIS — J42 Unspecified chronic bronchitis: Secondary | ICD-10-CM | POA: Diagnosis not present

## 2018-06-03 DIAGNOSIS — K08109 Complete loss of teeth, unspecified cause, unspecified class: Secondary | ICD-10-CM | POA: Diagnosis not present

## 2018-06-03 DIAGNOSIS — R030 Elevated blood-pressure reading, without diagnosis of hypertension: Secondary | ICD-10-CM | POA: Diagnosis not present

## 2018-06-03 DIAGNOSIS — Z681 Body mass index (BMI) 19 or less, adult: Secondary | ICD-10-CM | POA: Diagnosis not present

## 2018-06-03 DIAGNOSIS — R32 Unspecified urinary incontinence: Secondary | ICD-10-CM | POA: Diagnosis not present

## 2018-06-03 DIAGNOSIS — J309 Allergic rhinitis, unspecified: Secondary | ICD-10-CM | POA: Diagnosis not present

## 2018-06-03 DIAGNOSIS — R69 Illness, unspecified: Secondary | ICD-10-CM | POA: Diagnosis not present

## 2018-06-03 DIAGNOSIS — G8929 Other chronic pain: Secondary | ICD-10-CM | POA: Diagnosis not present

## 2018-06-03 DIAGNOSIS — I739 Peripheral vascular disease, unspecified: Secondary | ICD-10-CM | POA: Diagnosis not present

## 2018-06-03 DIAGNOSIS — R636 Underweight: Secondary | ICD-10-CM | POA: Diagnosis not present

## 2018-06-10 DIAGNOSIS — N329 Bladder disorder, unspecified: Secondary | ICD-10-CM | POA: Diagnosis not present

## 2018-06-10 DIAGNOSIS — Z1211 Encounter for screening for malignant neoplasm of colon: Secondary | ICD-10-CM | POA: Diagnosis not present

## 2018-06-10 DIAGNOSIS — R591 Generalized enlarged lymph nodes: Secondary | ICD-10-CM | POA: Diagnosis not present

## 2018-06-10 DIAGNOSIS — R030 Elevated blood-pressure reading, without diagnosis of hypertension: Secondary | ICD-10-CM | POA: Diagnosis not present

## 2018-06-10 DIAGNOSIS — Z0000 Encounter for general adult medical examination without abnormal findings: Secondary | ICD-10-CM | POA: Diagnosis not present

## 2018-06-10 DIAGNOSIS — M542 Cervicalgia: Secondary | ICD-10-CM | POA: Diagnosis not present

## 2018-06-13 ENCOUNTER — Encounter: Payer: Medicare HMO | Admitting: Obstetrics and Gynecology

## 2018-06-24 ENCOUNTER — Other Ambulatory Visit: Payer: Self-pay

## 2018-07-02 ENCOUNTER — Encounter: Payer: Medicare HMO | Admitting: Obstetrics and Gynecology

## 2018-07-11 ENCOUNTER — Encounter: Payer: Medicare HMO | Admitting: Obstetrics and Gynecology

## 2018-08-19 DIAGNOSIS — J441 Chronic obstructive pulmonary disease with (acute) exacerbation: Secondary | ICD-10-CM | POA: Diagnosis not present

## 2018-09-10 ENCOUNTER — Encounter: Payer: Self-pay | Admitting: Emergency Medicine

## 2018-09-10 ENCOUNTER — Emergency Department: Payer: Medicare HMO

## 2018-09-10 ENCOUNTER — Emergency Department
Admission: EM | Admit: 2018-09-10 | Discharge: 2018-09-10 | Disposition: A | Discharge status: Home / Self Care | Payer: Medicare HMO | Attending: Emergency Medicine | Admitting: Emergency Medicine

## 2018-09-10 DIAGNOSIS — M542 Cervicalgia: Secondary | ICD-10-CM | POA: Diagnosis not present

## 2018-09-10 DIAGNOSIS — Z79899 Other long term (current) drug therapy: Secondary | ICD-10-CM | POA: Diagnosis not present

## 2018-09-10 DIAGNOSIS — R05 Cough: Secondary | ICD-10-CM | POA: Diagnosis not present

## 2018-09-10 DIAGNOSIS — F17210 Nicotine dependence, cigarettes, uncomplicated: Secondary | ICD-10-CM | POA: Diagnosis not present

## 2018-09-10 DIAGNOSIS — R69 Illness, unspecified: Secondary | ICD-10-CM | POA: Diagnosis not present

## 2018-09-10 LAB — COMPREHENSIVE METABOLIC PANEL
ALK PHOS: 80 U/L (ref 38–126)
ALT: 14 U/L (ref 0–44)
AST: 21 U/L (ref 15–41)
Albumin: 3.9 g/dL (ref 3.5–5.0)
Anion gap: 8 (ref 5–15)
BUN: 14 mg/dL (ref 8–23)
CHLORIDE: 105 mmol/L (ref 98–111)
CO2: 29 mmol/L (ref 22–32)
CREATININE: 0.53 mg/dL (ref 0.44–1.00)
Calcium: 8.8 mg/dL — ABNORMAL LOW (ref 8.9–10.3)
GFR calc Af Amer: >60 mL/min (ref >60)
GFR calc non Af Amer: >60 mL/min (ref >60)
Glucose, Bld: 99 mg/dL (ref 70–99)
Potassium: 3.6 mmol/L (ref 3.5–5.1)
Sodium: 142 mmol/L (ref 135–145)
Total Bilirubin: 0.5 mg/dL (ref 0.3–1.2)
Total Protein: 7.2 g/dL (ref 6.5–8.1)

## 2018-09-10 LAB — CBC WITH DIFFERENTIAL/PLATELET
Abs Immature Granulocytes: 0.08 10*3/uL — ABNORMAL HIGH (ref 0.00–0.07)
Basophils Absolute: 0 10*3/uL (ref 0.0–0.1)
Basophils Relative: 0 %
EOS PCT: 2 %
Eosinophils Absolute: 0.1 10*3/uL (ref 0.0–0.5)
HEMATOCRIT: 38.7 % (ref 36.0–46.0)
HEMOGLOBIN: 12.7 g/dL (ref 12.0–15.0)
Immature Granulocytes: 1 %
LYMPHS ABS: 1.8 10*3/uL (ref 0.7–4.0)
LYMPHS PCT: 23 %
MCH: 32.6 pg (ref 26.0–34.0)
MCHC: 32.8 g/dL (ref 30.0–36.0)
MCV: 99.5 fL (ref 80.0–100.0)
MONO ABS: 0.4 10*3/uL (ref 0.1–1.0)
Monocytes Relative: 5 %
Neutro Abs: 5.4 10*3/uL (ref 1.7–7.7)
Neutrophils Relative %: 69 %
PLATELETS: 243 10*3/uL (ref 150–400)
RBC: 3.89 MIL/uL (ref 3.87–5.11)
RDW: 12.9 % (ref 11.5–15.5)
WBC: 7.9 10*3/uL (ref 4.0–10.5)
nRBC: 0 % (ref 0.0–0.2)

## 2018-09-10 MED ORDER — HYDROCODONE-ACETAMINOPHEN 5-325 MG PO TABS: 1.0000 | ORAL_TABLET | Freq: Four times a day (QID) | ORAL | 0 refills | Status: DC | PRN

## 2018-09-10 NOTE — Discharge Instructions (Addendum)
Follow-up with your primary care provider if any continued problems or not improving.  Begin taking hydrocodone 1 every 6 hours as needed for pain.  Do not drive or operate machinery while taking this medication.  Be aware that this medication could cause drowsiness and increase your risk for falling.  Lab work done in the ED today and your chest x-ray were within normal limits and your doctor should be able to see the results.

## 2018-09-10 NOTE — ED Triage Notes (Signed)
Patient presents to the ED with neck pain x 2 weeks.  Patient states she went to her PCP 1 week ago and was put on antibiotics for swollen lymph nodes.  Patient reports pain with moving her head.  Patient moving her head/neck very frequently in triage. Patient states the antibiotics have not helped her neck pain and she feels like she needs pain medication.  Patient states her husband passed away in October and she has been stressed.

## 2018-09-10 NOTE — ED Notes (Signed)
See triage note  Presents with stiffness to neck and into right shoulder  States she gets some relief with IBU and Icy/hot  Is able to move head w/o much trouble   afebrile

## 2018-09-10 NOTE — ED Provider Notes (Signed)
[facility] Emergency Department Provider Note  ____________________________________________   First MD Initiated Contact with Patient 09/10/18 1008     (approximate)  I have reviewed the triage vital signs and the nursing notes.   HISTORY  Chief Complaint Neck Pain   HPI Kimberly Berger is a 67 y.o. female presents with complaint of neck pain for 2 weeks.  Patient states that she went her PCP 1 week ago and was placed on antibiotics because she has swollen lymph nodes in her neck.  She states that there is been no injury to her neck either recently or in the past.  Patient states that the antibiotics did not seem to help and she is requesting "oxy".  She also states that her PCP is sending her next week for chest x-ray as she has had a cold for some time and she is a smoker.  She rates her pain as an 8 out of 10.   History reviewed. No pertinent past medical history.  There are no active problems to display for this patient.   History reviewed. No pertinent surgical history.  Prior to Admission medications   Medication Sig Start Date End Date Taking? Authorizing Provider  esomeprazole (NEXIUM) 40 MG capsule Take 1 capsule (40 mg total) by mouth daily. 07/06/16 07/06/17  Charlynne Pander, MD  famotidine (PEPCID) 20 MG tablet Take 1 tablet (20 mg total) by mouth 2 (two) times daily as needed for heartburn or indigestion. 07/06/16 07/06/17  Charlynne Pander, MD  HYDROcodone-acetaminophen (NORCO/VICODIN) 5-325 MG tablet Take 1 tablet by mouth every 6 (six) hours as needed for moderate pain. 09/10/18   Tommi Rumps, PA-C  ibuprofen (ADVIL,MOTRIN) 600 MG tablet Take 1 tablet (600 mg total) by mouth every 8 (eight) hours as needed for moderate pain (with food). 08/04/15   Rockne Menghini, MD    Allergies Patient has no known allergies.  Family History  Problem Relation Age of Onset  . Hypertension Father   . Breast cancer Maternal Aunt      Social History Social History   Tobacco Use  . Smoking status: Current Every Day Smoker    Types: Cigarettes  . Smokeless tobacco: Never Used  Substance Use Topics  . Alcohol use: No  . Drug use: No    Review of Systems Constitutional: No fever/chills Eyes: No visual changes. ENT: No sore throat.  Negative for nasal congestion. Cardiovascular: Denies chest pain. Respiratory: Denies shortness of breath.  Positive for nonproductive cough. Gastrointestinal: No abdominal pain.  No nausea, no vomiting.  No diarrhea.  Genitourinary: Negative for dysuria. Musculoskeletal: Positive for neck pain. Skin: Negative for rash. Neurological: Negative for headaches, focal weakness or numbness. ___________________________________________   PHYSICAL EXAM:  VITAL SIGNS: ED Triage Vitals  Enc Vitals Group     BP 09/10/18 1003 (!) 157/71     Pulse Rate 09/10/18 1003 81     Resp 09/10/18 1003 18     Temp 09/10/18 1003 98.1 F (36.7 C)     Temp Source 09/10/18 1003 Oral     SpO2 09/10/18 1003 99 %     Weight 09/10/18 1004 110 lb (49.9 kg)     Height 09/10/18 1004 5\' 4"  (1.626 m)     Head Circumference --      Peak Flow --      Pain Score 09/10/18 1004 8     Pain Loc --      Pain Edu? --  Excl. in GC? --    Constitutional: Alert and oriented. Well appearing and in no acute distress. Eyes: Conjunctivae are normal.  Head: Atraumatic. Nose: Minimal congestion/rhinnorhea. Mouth/Throat: Mucous membranes are moist.  Oropharynx non-erythematous. Neck: No stridor.   Hematological/Lymphatic/Immunilogical: Bilateral minimal tender cervical lymphadenopathy. Cardiovascular: Normal rate, regular rhythm. Grossly normal heart sounds.  Good peripheral circulation. Respiratory: Normal respiratory effort.  No retractions. Lungs no wheezing or respiratory difficulty.  No accessory muscles used.  Patient has a very congested cough. Gastrointestinal: Soft and nontender. No distention.   Musculoskeletal: Moves upper and lower extremities that any difficulty,  no edema is present.  Normal gait was noted. Neurologic:  Normal speech and language. No gross focal neurologic deficits are appreciated. No gait instability. Skin:  Skin is warm, dry and intact. No rash noted. Psychiatric: Mood and affect are normal. Speech and behavior are normal.  ____________________________________________   LABS (all labs ordered are listed, but only abnormal results are displayed)  Labs Reviewed  COMPREHENSIVE METABOLIC PANEL - Abnormal; Notable for the following components:      Result Value   Calcium 8.8 (*)    All other components within normal limits  CBC WITH DIFFERENTIAL/PLATELET - Abnormal; Notable for the following components:   Abs Immature Granulocytes 0.08 (*)    All other components within normal limits    RADIOLOGY  Official radiology report(s): Dg Chest 2 View  Result Date: 09/10/2018 CLINICAL DATA:  Cough, wheezing, smoker EXAM: CHEST - 2 VIEW COMPARISON:  02/03/2018 FINDINGS: Hyperinflation of the lungs. Heart and mediastinal contours are within normal limits. No focal opacities or effusions. No acute bony abnormality. IMPRESSION: Hyperinflation.  No active disease. Electronically Signed   By: Charlett NoseKevin  Dover M.D.   On: 09/10/2018 10:54    ____________________________________________   PROCEDURES  Procedure(s) performed: None  Procedures  Critical Care performed: No  ____________________________________________   INITIAL IMPRESSION / ASSESSMENT AND PLAN / ED COURSE  As part of my medical decision making, I reviewed the following data within the electronic MEDICAL RECORD NUMBER Notes from prior ED visits and  Controlled Substance Database  67 year old female presents to the ED with complaint of neck pain for 2 weeks.  She states that she took antibiotics after being seen by her PCP for swollen lymph nodes.  She states that she continues to have some lymph  adenopathy.  She expresses that she does not want any lab work or chest x-ray she just wants OxyContin.  She states that this makes her pain go away.  Patient was told that she would need to see her PCP if this is the only thing that she is in the ED for and she then consented to have at least basic lab work and chest x-ray.  These were unremarkable.  Patient was told that OxyContin is addictive.  She was given a prescription for Norco 1 every 6 hours as needed for pain #12.  Patient is instructed to follow-[facility] with [facility] if any continued pain medication as needed.  She is also here with a another patient being seen by this provider who is also here requesting pain medication.  [place] substance [place] was consulted prior to writing prescriptions for this patient and patient was given a limited number of Norco in the ED in April 2019.  ____________________________________________   FINAL CLINICAL IMPRESSION(S) / ED DIAGNOSES  Final diagnoses:  Neck pain, acute  Cigarette smoker     ED Discharge Orders  Ordered    HYDROcodone-acetaminophen (NORCO/VICODIN) 5-325 MG tablet  Every 6 hours PRN     09/10/18 1125           Note:  This document was prepared using Dragon voice recognition software and may include unintentional dictation errors.    Tommi Rumps, PA-C 09/10/18 1324    Charlynne Pander, MD 09/10/18 1535

## 2018-12-19 ENCOUNTER — Encounter: Payer: Self-pay | Admitting: Obstetrics and Gynecology

## 2018-12-19 ENCOUNTER — Ambulatory Visit: Payer: Medicare HMO | Admitting: Obstetrics and Gynecology

## 2018-12-19 VITALS — BP 136/84 | HR 95 | Ht 64.0 in | Wt 110.1 lb

## 2018-12-19 DIAGNOSIS — N952 Postmenopausal atrophic vaginitis: Secondary | ICD-10-CM

## 2018-12-19 DIAGNOSIS — N813 Complete uterovaginal prolapse: Secondary | ICD-10-CM | POA: Insufficient documentation

## 2018-12-19 NOTE — Progress Notes (Signed)
    GYNECOLOGY PROGRESS NOTE  Subjective:    Patient ID: RAYELLA JUNGE, female    DOB: Jun 06, 1951, 68 y.o.   MRN: [id number]  HPI  Patient is a 68 y.o. G14P0 female who presents for pessary insertion.  She has missed multiple appointments for insertion in the past, but notes that she just could not take the vaginal bulge anymore, as she was also now experiencing back pain from her uterine prolapse.    The following portions of the patient's history were reviewed and updated as appropriate: allergies, current medications, past family history, past medical history, past social history, past surgical history and problem list.  Review of Systems Pertinent items noted in HPI and remainder of comprehensive ROS otherwise negative.   Objective:   Blood pressure 136/84, pulse 95, height 5\' 4"  (1.626 m), weight 110 lb 1.6 oz (49.9 kg). General appearance: alert and no distress Abdomen: soft, non-tender; bowel sounds normal; no masses,  no organomegaly Pelvic: external genitalia normal, rectovaginal septum normal.  Vagina without discharge, mild atrophy present.  Cervix normal appearing, no lesions and no motion tenderness, protruding beyond the vaginal introitus (Grade 3 uterine prolapse).  Uterus mobile, nontender, normal shape and size.  Adnexae non-palpable, nontender bilaterally.  Extremities: extremities normal, atraumatic, no cyanosis or edema Neurologic: Grossly normal   Assessment:   Grade 3 uterine prolapse Vaginal atrophy  Plan:   - Size 2 ring pessary with support placed today.  To f/u in 2-3 weeks to f/u with pessary. Will use Trimo-san gel to maintain pessary, use weekly.  - Vaginal atrophy, not bothersome to patient, mild. No other treatment required at this time.    Hildred Laser, MD [facility]

## 2018-12-19 NOTE — Patient Instructions (Signed)
Pessary Maintenance   If you are able to care for your pessary at home, we typically recommend that you take it out and clean it daily. You should use a mild soap with water, rinse and dry it completely, and reinsert it into the vagina the next morning. It is OK to keep it in for a longer period of time but never more than 3 months at a time.  If you care for your own pessary, we will usually have you come to the office for an examination initially in 2 weeks, and then in 3 months. After one year of use, you can come and see [place] 2 to 3 times a year. Most pessaries last for several years.  It is not uncommon for the pessary to fall out when you are having a bowel movement. Check the toilet before you flush. It the pessary does fall into the commode, clean it with soap and water, and soak it for 20 minutes in rubbing alcohol. After this, soak it for 20 minutes in water, and wash again with soap and water. Rinse well. You may then insert it in the vagina.  If you are unable to remove and reinsert your own pessary, we will want to see you in the office for cleaning and examination every three months.

## 2018-12-19 NOTE — Progress Notes (Signed)
Pt is present today for pessary insertion. Pt stated that she was doing well no problems.  

## 2019-02-12 ENCOUNTER — Telehealth: Payer: Self-pay | Admitting: Obstetrics and Gynecology

## 2019-02-12 NOTE — Telephone Encounter (Signed)
The patient called and stated that after she urinates her pessary moves down very low, The patient also stated that she is experiencing lower back and abdominal pain with the pessary as well. The patient is requesting a call back from the nurse to discuss what she can do moving forward, or maybe the pessary is not working for her. Please advise.

## 2019-02-12 NOTE — Telephone Encounter (Signed)
LM for patient to return call.

## 2019-02-13 NOTE — Telephone Encounter (Signed)
Pt called no answer LM to call the office on Monday morning to set up an appointment to see [facility].

## 2019-02-18 NOTE — Telephone Encounter (Signed)
LM for patient to return call.

## 2019-02-23 ENCOUNTER — Encounter: Payer: Medicare HMO | Admitting: Obstetrics and Gynecology

## 2019-02-23 NOTE — Progress Notes (Signed)
Pt is present today for pessary issues. Pt called and stated that her pessary feels like it is falling out.

## 2019-02-25 ENCOUNTER — Other Ambulatory Visit: Payer: Self-pay

## 2019-02-25 ENCOUNTER — Encounter: Payer: Self-pay | Admitting: Obstetrics and Gynecology

## 2019-02-25 ENCOUNTER — Ambulatory Visit (INDEPENDENT_AMBULATORY_CARE_PROVIDER_SITE_OTHER): Payer: Medicare HMO | Admitting: Obstetrics and Gynecology

## 2019-02-25 VITALS — BP 148/78 | HR 85 | Ht 64.0 in | Wt 105.8 lb

## 2019-02-25 DIAGNOSIS — N813 Complete uterovaginal prolapse: Secondary | ICD-10-CM | POA: Diagnosis not present

## 2019-02-25 DIAGNOSIS — Z4689 Encounter for fitting and adjustment of other specified devices: Secondary | ICD-10-CM | POA: Diagnosis not present

## 2019-02-25 NOTE — Progress Notes (Signed)
    GYNECOLOGY PROGRESS NOTE  Subjective:    Patient ID: Kimberly Berger, female    DOB: 04-26-1951, 68 y.o.   MRN: [id number]  HPI  Patient is a 68 y.o. G42P0 female who presents for pessary check.  Pessary is currently in place for third degree uterine prolapse.  She states that over the past 1-2 weeks it feels like her pessary is going to fall out. Notes that she has tried to push it back up but after 1-2 days she gets the same feeling again.  She notes that she is very active at home, and when doing things like mowing her grass or tending to her yard is when she feels the pessary slipping. She denies any vaginal bleeding, or difficulty with urination or bowel movements. Notes that she would like to leave the pessary out for a while.    The following portions of the patient's history were reviewed and updated as appropriate: allergies, current medications, past family history, past medical history, past social history, past surgical history and problem list.  Review of Systems Pertinent items noted in HPI and remainder of comprehensive ROS otherwise negative.   Objective:   Blood pressure (!) 148/78, pulse 85, height 5\' 4"  (1.626 m), weight 105 lb 12.8 oz (48 kg). General appearance: alert and no distress Abdomen: soft, non-tender; bowel sounds normal; no masses,  no organomegaly Pelvic: ?The patient's Size 2 ring with support pessary was removed, cleaned without complications. Speculum examination revealed normal vaginal mucosa with no lesions or lacerations.  Assessment:   Pessary maintenance Third degree uterine prolapse  Plan:   - Patient desires to leave the pessary out for now. Discussed that her symptoms of prolapse and pelvic pressure may return without the use of the pessary.  Advised patient to follow up if symptoms worsen to the point of desiring the pessary to be replaced.  Will also order next size up (2 1/2 inch) with increased support (I.e. Shaatz or Gelhorn pessary) due  to patient's increased activity.  - To follow up as needed.

## 2019-06-30 ENCOUNTER — Other Ambulatory Visit: Payer: Self-pay | Admitting: Family Medicine

## 2019-06-30 DIAGNOSIS — Z13820 Encounter for screening for osteoporosis: Secondary | ICD-10-CM

## 2019-06-30 DIAGNOSIS — Z1231 Encounter for screening mammogram for malignant neoplasm of breast: Secondary | ICD-10-CM

## 2019-07-17 ENCOUNTER — Other Ambulatory Visit: Payer: Self-pay

## 2019-07-17 DIAGNOSIS — Z20822 Contact with and (suspected) exposure to covid-19: Secondary | ICD-10-CM

## 2019-07-18 LAB — NOVEL CORONAVIRUS, NAA: SARS-CoV-2, NAA: NOT DETECTED

## 2019-08-20 ENCOUNTER — Telehealth: Payer: Self-pay

## 2019-08-20 ENCOUNTER — Encounter: Payer: Medicare HMO | Admitting: Obstetrics and Gynecology

## 2019-08-20 NOTE — Telephone Encounter (Signed)
Pt called to resched pessary insertion appt with [facility] for two weeks later per [facility] for ordering.  Pt wants advice of any other things she can do in the meantime. Please call pt.

## 2019-09-02 ENCOUNTER — Encounter: Payer: Medicare HMO | Admitting: Obstetrics and Gynecology

## 2019-09-29 ENCOUNTER — Encounter: Payer: Self-pay | Admitting: Obstetrics and Gynecology

## 2019-09-29 ENCOUNTER — Other Ambulatory Visit: Payer: Self-pay

## 2019-09-29 ENCOUNTER — Ambulatory Visit (INDEPENDENT_AMBULATORY_CARE_PROVIDER_SITE_OTHER): Payer: Medicare HMO | Admitting: Obstetrics and Gynecology

## 2019-09-29 VITALS — BP 154/85 | HR 88 | Ht 64.0 in | Wt 109.4 lb

## 2019-09-29 DIAGNOSIS — N952 Postmenopausal atrophic vaginitis: Secondary | ICD-10-CM

## 2019-09-29 DIAGNOSIS — Z4689 Encounter for fitting and adjustment of other specified devices: Secondary | ICD-10-CM

## 2019-09-29 DIAGNOSIS — N813 Complete uterovaginal prolapse: Secondary | ICD-10-CM | POA: Diagnosis not present

## 2019-09-29 NOTE — Progress Notes (Signed)
    GYNECOLOGY PROGRESS NOTE  Subjective:    Patient ID: Kimberly Berger, female    DOB: 1950/11/15, 68 y.o.   MRN: [id number]  HPI  Patient is a 68 y.o. G3P0 female who presents for new pessary insertion. Previously had Size 2 ring with support Has h/o 3rd degree uterine prolapse.  Notes feeling pelvic pressure. Denies bleeding, or difficulty with urination or bowel movements.  The following portions of the patient's history were reviewed and updated as appropriate: allergies, current medications, past family history, past medical history, past social history, past surgical history and problem list.  Review of Systems Pertinent items noted in HPI and remainder of comprehensive ROS otherwise negative.   Objective:   Blood pressure (!) 154/85, pulse 88, height 5\' 4"  (1.626 m), weight 109 lb 6.4 oz (49.6 kg). General appearance: alert and no distress Abdomen: soft, non-tender; bowel sounds normal; no masses,  no organomegaly Pelvic:  external genitalia normal, rectovaginal septum normal.  Vagina without discharge, mild atrophy present.  Cervix normal appearing, no lesions and no motion tenderness, protruding beyond the vaginal introitus (Grade 3 uterine prolapse).  Uterus mobile, nontender, normal shape and size.  Adnexae non-palpable, nontender bilaterally.   Assessment:   Encounter for pessary insertion Grade 3 uterine prolapse Vaginal atrophy  Plan:   - Size 2 Gelhorn pessary placed today.  To f/u in 2-3 weeks to f/u with pessary. Will use Trimo-san gel to maintain pessary, use weekly.  - Vaginal atrophy, not bothersome to patient, mild. No other treatment required at this time.    Rubie Maid, MD [facility]

## 2019-09-29 NOTE — Patient Instructions (Signed)
Pelvic Organ Prolapse °Pelvic organ prolapse is the stretching, bulging, or dropping of pelvic organs into an abnormal position. It happens when the muscles and tissues that surround and support pelvic structures become weak or stretched. Pelvic organ prolapse can involve the: °· Vagina (vaginal prolapse). °· Uterus (uterine prolapse). °· Bladder (cystocele). °· Rectum (rectocele). °· Intestines (enterocele). °When organs other than the vagina are involved, they often bulge into the vagina or protrude from the vagina, depending on how severe the prolapse is. °What are the causes? °This condition may be caused by: °· Pregnancy, labor, and childbirth. °· Past pelvic surgery. °· Decreased production of the hormone estrogen associated with menopause. °· Consistently lifting more than 50 lb (23 kg). °· Obesity. °· Long-term inability to pass stool (chronic constipation). °· A cough that lasts a long time (chronic). °· Buildup of fluid in the abdomen due to certain diseases and other conditions. °What are the signs or symptoms? °Symptoms of this condition include: °· Passing a little urine (loss of bladder control) when you cough, sneeze, strain, and exercise (stress incontinence). This may be worse immediately after childbirth. It may gradually improve over time. °· Feeling pressure in your pelvis or vagina. This pressure may increase when you cough or when you are passing stool. °· A bulge that protrudes from the opening of your vagina. °· Difficulty passing urine or stool. °· Pain in your lower back. °· Pain, discomfort, or disinterest in sex. °· Repeated bladder infections (urinary tract infections). °· Difficulty inserting a tampon. °In some people, this condition causes no symptoms. °How is this diagnosed? °This condition may be diagnosed based on a vaginal and rectal exam. During the exam, you may be asked to cough and strain while you are lying down, sitting, and standing up. Your health care provider will  determine if other tests are required, such as bladder function tests. °How is this treated? °Treatment for this condition may depend on your symptoms. Treatment may include: °· Lifestyle changes, such as changes to your diet. °· Emptying your bladder at scheduled times (bladder training therapy). This can help reduce or avoid urinary incontinence. °· Estrogen. Estrogen may help mild prolapse by increasing the strength and tone of pelvic floor muscles. °· Kegel exercises. These may help mild cases of prolapse by strengthening and tightening the muscles of the pelvic floor. °· A soft, flexible device that helps support the vaginal walls and keep pelvic organs in place (pessary). This is inserted into your vagina by your health care provider. °· Surgery. This is often the only form of treatment for severe prolapse. °Follow these instructions at home: °· Avoid drinking beverages that contain caffeine or alcohol. °· Increase your intake of high-fiber foods. This can help decrease constipation and straining during bowel movements. °· Lose weight if recommended by your health care provider. °· Wear a sanitary pad or adult diapers if you have urinary incontinence. °· Avoid heavy lifting and straining with exercise and work. Do not hold your breath when you perform mild to moderate lifting and exercise activities. Limit your activities as directed by your health care provider. °· Do Kegel exercises as directed by your health care provider. To do this: °? Squeeze your pelvic floor muscles tight. You should feel a tight lift in your rectal area and a tightness in your vaginal area. Keep your stomach, buttocks, and legs relaxed. °? Hold the muscles tight for up to 10 seconds. °? Relax your muscles. °? Repeat this exercise 50 times a day,   or as many times as told by your health care provider. Continue to do this exercise for at least 4-6 weeks, or for as long as told by your health care provider. °· Take over-the-counter and  prescription medicines only as told by your health care provider. °· If you have a pessary, take care of it as told by your health care provider. °· Keep all follow-up visits as told by your health care provider. This is important. °Contact a health care provider if you: °· Have symptoms that interfere with your daily activities or sex life. °· Need medicine to help with the discomfort. °· Notice bleeding from your vagina that is not related to your period. °· Have a fever. °· Have pain or bleeding when you urinate. °· Have bleeding when you pass stool. °· Pass urine when you have sex. °· Have chronic constipation. °· Have a pessary that falls out. °· Have bad smelling vaginal discharge. °· Have an unusual, low pain in your abdomen. °Summary °· Pelvic organ prolapse is the stretching, bulging, or dropping of pelvic organs into an abnormal position. It happens when the muscles and tissues that surround and support pelvic structures become weak or stretched. °· When organs other than the vagina are involved, they often bulge into the vagina or protrude from the vagina, depending on how severe the prolapse is. °· In most cases, this condition needs to be treated only if it produces symptoms. Treatment may include lifestyle changes, estrogen, Kegel exercises, pessary insertion, or surgery. °· Avoid heavy lifting and straining with exercise and work. Do not hold your breath when you perform mild to moderate lifting and exercise activities. Limit your activities as directed by your health care provider. °This information is not intended to replace advice given to you by your health care provider. Make sure you discuss any questions you have with your health care provider. °Document Released: 04/28/2014 Document Revised: 10/23/2017 Document Reviewed: 10/23/2017 °Elsevier Patient Education © 2020 [employer] ° °

## 2019-09-29 NOTE — Progress Notes (Signed)
Pt present for pessary insertion. Pt stated that she was doing well no problems.  

## 2019-10-21 ENCOUNTER — Ambulatory Visit
Admission: RE | Admit: 2019-10-21 | Discharge: 2019-10-21 | Disposition: A | Discharge status: Home / Self Care | Payer: Medicare HMO | Source: Ambulatory Visit | Attending: Family Medicine | Admitting: Family Medicine

## 2019-10-21 DIAGNOSIS — Z1231 Encounter for screening mammogram for malignant neoplasm of breast: Secondary | ICD-10-CM | POA: Insufficient documentation

## 2019-10-22 ENCOUNTER — Telehealth: Payer: Self-pay | Admitting: Obstetrics and Gynecology

## 2019-10-22 NOTE — Telephone Encounter (Signed)
Pt called in and stated that her PESSARY CUP is falling out. Pt is requesting a call from the nurse. Please advise

## 2019-10-23 NOTE — Telephone Encounter (Signed)
OK.  We will reinsert it at her next visit.   Dr. Valentino Saxon

## 2019-10-23 NOTE — Telephone Encounter (Signed)
Pt stated that her pessary fell out when she was having a BM. Pt stated that she was not able to replace it. Pt was advised to clean the pessary with baby shampoo and water and place in a  ziplock bag. Pt stated that she was okay to wait until her follow up appointment to have it replaced.

## 2019-10-27 ENCOUNTER — Encounter: Payer: Self-pay | Admitting: Obstetrics and Gynecology

## 2019-10-27 ENCOUNTER — Other Ambulatory Visit: Payer: Self-pay

## 2019-10-27 ENCOUNTER — Ambulatory Visit (INDEPENDENT_AMBULATORY_CARE_PROVIDER_SITE_OTHER): Payer: Medicare HMO | Admitting: Obstetrics and Gynecology

## 2019-10-27 VITALS — BP 144/76 | HR 85 | Ht 64.0 in | Wt 110.0 lb

## 2019-10-27 DIAGNOSIS — R350 Frequency of micturition: Secondary | ICD-10-CM | POA: Diagnosis not present

## 2019-10-27 DIAGNOSIS — Z4689 Encounter for fitting and adjustment of other specified devices: Secondary | ICD-10-CM | POA: Diagnosis not present

## 2019-10-27 DIAGNOSIS — N952 Postmenopausal atrophic vaginitis: Secondary | ICD-10-CM | POA: Diagnosis not present

## 2019-10-27 LAB — POCT URINALYSIS DIPSTICK
Bilirubin, UA: NEGATIVE
Glucose, UA: NEGATIVE
Ketones, UA: NEGATIVE
Nitrite, UA: NEGATIVE
Protein, UA: POSITIVE — AB
Spec Grav, UA: 1.015 (ref 1.010–1.025)
Urobilinogen, UA: 0.2 E.U./dL
pH, UA: 6.5 (ref 5.0–8.0)

## 2019-10-27 NOTE — Progress Notes (Signed)
Pt present for pessary check. Pt stated that she has been doing a lot of frequent urination UA completed.

## 2019-10-27 NOTE — Progress Notes (Signed)
    GYNECOLOGY PROGRESS NOTE  Subjective:    Patient ID: Kimberly Berger, female    DOB: Sep 29, 1951, 69 y.o.   MRN: [id number]  HPI  Patient is a 69 y.o. G41P0 female who presents today for a 2 week pessary check. She currently has a Grade 3 uterine prolapse. She reports feeling like the pessary is hurting her (mostly noting some back pain and discomfort). Also concerned that she felt the tip of the pessary and thought it was going to come out. States that she pushed it back up.  She denies vaginal bleeding or discharge. She also is complaining of more frequent urinating, she denies or moving her bowels.    The following portions of the patient's history were reviewed and updated as appropriate: allergies, current medications, past family history, past medical history, past social history, past surgical history and problem list.  Review of Systems Pertinent items noted in HPI and remainder of comprehensive ROS otherwise negative.   Objective:   Blood pressure (!) 144/76, pulse 85, height 5\' 4"  (1.626 m), weight 110 lb (49.9 kg). General appearance: alert and no distress Abdomen: soft, non-tender; bowel sounds normal; no masses,  no organomegaly Pelvic: The patient's 2 Gelhorn pessary was removed, cleaned. Speculum examination revealed atrophic vaginal mucosa with no lesions or lacerations.    Labs:  Results for orders placed or performed in visit on 10/27/19  POCT Urinalysis Dipstick  Result Value Ref Range   Color, UA yellow    Clarity, UA clear    Glucose, UA Negative Negative   Bilirubin, UA neg    Ketones, UA neg    Spec Grav, UA 1.015 1.010 - 1.025   Blood, UA trace    pH, UA 6.5 5.0 - 8.0   Protein, UA Positive (A) Negative   Urobilinogen, UA 0.2 0.2 or 1.0 E.U./dL   Nitrite, UA neg    Leukocytes, UA Moderate (2+) (A) Negative   Appearance yellow;clear    Odor      Assessment:   1. Pessary maintenance 2. Grade 3 uterine prolapse 3. Vaginal atrophy 4. Frequent  urination  Plan:   - The patient desires to remove current pessary for now.  Notes that if she begins to feel symptoms of her prolapse again, may return and replace her old pessary (Size 2 ring with support). Advised patient that if pessary did not work to help relieve symptoms, may need to consider surgical intervention with hysterectomy.   - Vaginal atrophy not bothersome to patient. Since she will not be continuing use of her pessary at this time, does not require treatment.  - Frequent urination, no obvious signs of UTI on exam, UA with some protein noted. Pessary removed to see if symptoms resolve. If symptoms worsen or fail to improve, can test/treat for UTI.    12/25/19, MD [facility]

## 2019-12-12 ENCOUNTER — Other Ambulatory Visit: Payer: Self-pay

## 2019-12-12 ENCOUNTER — Emergency Department
Admission: EM | Admit: 2019-12-12 | Discharge: 2019-12-12 | Disposition: A | Discharge status: Home / Self Care | Payer: Medicare HMO | Attending: Emergency Medicine | Admitting: Emergency Medicine

## 2019-12-12 ENCOUNTER — Emergency Department: Payer: Medicare HMO

## 2019-12-12 DIAGNOSIS — F17210 Nicotine dependence, cigarettes, uncomplicated: Secondary | ICD-10-CM | POA: Insufficient documentation

## 2019-12-12 DIAGNOSIS — Z7982 Long term (current) use of aspirin: Secondary | ICD-10-CM | POA: Diagnosis not present

## 2019-12-12 DIAGNOSIS — R42 Dizziness and giddiness: Secondary | ICD-10-CM | POA: Diagnosis not present

## 2019-12-12 DIAGNOSIS — I10 Essential (primary) hypertension: Secondary | ICD-10-CM

## 2019-12-12 HISTORY — DX: Essential (primary) hypertension: I10

## 2019-12-12 LAB — URINALYSIS, COMPLETE (UACMP) WITH MICROSCOPIC
Bacteria, UA: NONE SEEN
Bilirubin Urine: NEGATIVE
Glucose, UA: NEGATIVE mg/dL
Hgb urine dipstick: NEGATIVE
Ketones, ur: NEGATIVE mg/dL
Leukocytes,Ua: NEGATIVE
Nitrite: NEGATIVE
Protein, ur: NEGATIVE mg/dL
Specific Gravity, Urine: 1.023 (ref 1.005–1.030)
pH: 7 (ref 5.0–8.0)

## 2019-12-12 LAB — COMPREHENSIVE METABOLIC PANEL
ALT: 16 U/L (ref 0–44)
AST: 21 U/L (ref 15–41)
Albumin: 3.7 g/dL (ref 3.5–5.0)
Alkaline Phosphatase: 75 U/L (ref 38–126)
Anion gap: 10 (ref 5–15)
BUN: 17 mg/dL (ref 8–23)
CO2: 27 mmol/L (ref 22–32)
Calcium: 8.9 mg/dL (ref 8.9–10.3)
Chloride: 102 mmol/L (ref 98–111)
Creatinine, Ser: 0.6 mg/dL (ref 0.44–1.00)
GFR calc Af Amer: >60 mL/min (ref >60)
GFR calc non Af Amer: >60 mL/min (ref >60)
Glucose, Bld: 131 mg/dL — ABNORMAL HIGH (ref 70–99)
Potassium: 3.6 mmol/L (ref 3.5–5.1)
Sodium: 139 mmol/L (ref 135–145)
Total Bilirubin: 0.5 mg/dL (ref 0.3–1.2)
Total Protein: 7.1 g/dL (ref 6.5–8.1)

## 2019-12-12 LAB — CBC
HCT: 39.1 % (ref 36.0–46.0)
Hemoglobin: 12.9 g/dL (ref 12.0–15.0)
MCH: 32.1 pg (ref 26.0–34.0)
MCHC: 33 g/dL (ref 30.0–36.0)
MCV: 97.3 fL (ref 80.0–100.0)
Platelets: 230 10*3/uL (ref 150–400)
RBC: 4.02 MIL/uL (ref 3.87–5.11)
RDW: 12.4 % (ref 11.5–15.5)
WBC: 7.5 10*3/uL (ref 4.0–10.5)
nRBC: 0 % (ref 0.0–0.2)

## 2019-12-12 LAB — TROPONIN I (HIGH SENSITIVITY): Troponin I (High Sensitivity): 3 ng/L (ref <18)

## 2019-12-12 MED ORDER — MECLIZINE HCL 25 MG PO TABS: 25.0000 mg | ORAL_TABLET | Freq: Three times a day (TID) | ORAL | 0 refills | Status: AC | PRN

## 2019-12-12 MED ORDER — MECLIZINE HCL 25 MG PO TABS
25.0000 mg | ORAL_TABLET | Freq: Once | ORAL | Status: AC
  Administered 2019-12-12: 25 mg via ORAL
  Filled 2019-12-12: qty 1

## 2019-12-12 NOTE — ED Triage Notes (Signed)
Patient arrived via [facility] from home. Patient is AOx4 and ambulatory and was able to move self from EMS stretcher to ED stretcher. Patient called 911 due to feeling "woozy" and feeling somewhat nauseous which began when patients 2x older sons began arguing and fighting each other. Patient is no pain, only complaint is she has dizziness when head is moved to the right.

## 2019-12-12 NOTE — ED Notes (Signed)
Pt phone given to patient - she wants to call her daughter and update her.

## 2019-12-12 NOTE — ED Notes (Signed)
Patient transported to CT 

## 2019-12-12 NOTE — ED Provider Notes (Signed)
[facility] [address] Emergency Department Provider Note  Time seen: 10:09 AM  I have reviewed the triage vital signs and the nursing notes.   HISTORY  Chief Complaint Dizziness  HPI Kimberly Berger is a 69 y.o. female with no significant medical history presents to the emergency department for dizziness. According to the patient she states her 2 grandchildren were fighting which awoke her from her sleep. States she sat up very quickly and began feeling very dizzy which she describes as a spinning sensation and nauseated. Denies any headache. Denies any chest pain or abdominal pain. No recent illnesses such as cough congestion fever shortness of breath nausea vomiting or diarrhea. Patient continues to state very slight dizziness but only when she turns her head per patient. No history of vertigo previously.   Past Medical History:  Diagnosis Date  . No pertinent past medical history     Patient Active Problem List   Diagnosis Date Noted  . Vaginal atrophy 12/19/2018  . Third degree uterine prolapse 12/19/2018    Past Surgical History:  Procedure Laterality Date  . no surgery history      Prior to Admission medications   Medication Sig Start Date End Date Taking? Authorizing Provider  aspirin 325 MG EC tablet Take 325 mg by mouth daily.    [provider]  ibuprofen (ADVIL,MOTRIN) 600 MG tablet Take 1 tablet (600 mg total) by mouth every 8 (eight) hours as needed for moderate pain (with food). 08/04/15   Rockne Menghini, MD    No Known Allergies  Family History  Problem Relation Age of Onset  . Hypertension Father   . Breast cancer Maternal Aunt     Social History Social History   Tobacco Use  . Smoking status: Current Every Day Smoker    Types: Cigarettes  . Smokeless tobacco: Never Used  Substance Use Topics  . Alcohol use: No  . Drug use: No    Review of Systems Constitutional: Negative for fever. Cardiovascular: Negative for chest  pain. Respiratory: Negative for shortness of breath. Gastrointestinal: Negative for abdominal pain, vomiting Musculoskeletal: Negative for musculoskeletal complaints Neurological: Negative for headache All other ROS negative  ____________________________________________   PHYSICAL EXAM:  Constitutional: Alert and oriented. Well appearing and in no distress. Eyes: Normal exam ENT      Head: Normocephalic and atraumatic.      Mouth/Throat: Mucous membranes are moist. Cardiovascular: Normal rate, regular rhythm.  Respiratory: Normal respiratory effort without tachypnea nor retractions. Breath sounds are clear  Gastrointestinal: Soft and nontender. No distention. Musculoskeletal: Nontender with normal range of motion in all extremities.  Neurologic:  Normal speech and language. No gross focal neurologic deficits  Skin:  Skin is warm, dry and intact.  Psychiatric: Mood and affect are normal.   ____________________________________________    EKG  EKG viewed and interpreted by myself shows a normal sinus rhythm at 75 bpm with a narrow QRS, normal axis, largely normal intervals with nonspecific ST changes. No ST elevation.  ____________________________________________    RADIOLOGY  CT head negative  ____________________________________________   INITIAL IMPRESSION / ASSESSMENT AND PLAN / ED COURSE  Pertinent labs & imaging results that were available during my care of the patient were reviewed by me and considered in my medical decision making (see chart for details).   Patient presents to the emergency department acute onset of dizziness. Patient states the dizziness started acutely when she sat up in bed when she was awoken from her sleep due to  her grandchildren fighting. Patient states that dizziness is mostly when she turns her head or sits up. States no dizziness currently if she lies still in bed. Differential would include vertigo/BPPV, electrolyte or metabolic  abnormality, less likely CVA. We will check labs, CT scan of the head and continue to closely monitor. We will dose meclizine while awaiting results.  CT scan is negative.  Labs are reassuring.  Symptoms are suggestive of vertigo.  Patient states she is feeling much better, has been able to ambulate to the restroom without issue.  We will discharge with a short course of meclizine.  I discussed with the patient return precautions especially for any worsening dizziness weakness numbness or slurred speech.  Kimberly Berger was evaluated in Emergency Department on 12/12/2019 for the symptoms described in the history of present illness. She was evaluated in the context of the global COVID-19 pandemic, which necessitated consideration that the patient might be at risk for infection with the SARS-CoV-2 virus that causes COVID-19. Institutional protocols and algorithms that pertain to the evaluation of patients at risk for COVID-19 are in a state of rapid change based on information released by regulatory bodies including the CDC and federal and state organizations. These policies and algorithms were followed during the patient's care in the ED.  ____________________________________________   FINAL CLINICAL IMPRESSION(S) / ED DIAGNOSES  Dizziness   Harvest Dark, MD 12/12/19 1206

## 2019-12-12 NOTE — ED Notes (Signed)
Patient ambulated to bedside commode without assistance from staff or device.

## 2019-12-12 NOTE — ED Notes (Signed)
Patient has been informed to hit call bell when patient needs to urinate and that we also need a sample for testing.

## 2019-12-12 NOTE — ED Notes (Signed)
Pt states feeling much better. Her dog died last night, and she was arguing with her family when she began to not feel good. States she will work on decreasing stress in life. She will call daughter for a ride home. Another daughter called while pt was speaking to the other daughter in the background. Pt will update that daughter as well.

## 2020-01-26 ENCOUNTER — Encounter: Payer: Medicare HMO | Admitting: Obstetrics and Gynecology

## 2020-02-17 ENCOUNTER — Encounter: Payer: Self-pay | Admitting: Obstetrics and Gynecology

## 2020-02-17 ENCOUNTER — Ambulatory Visit (INDEPENDENT_AMBULATORY_CARE_PROVIDER_SITE_OTHER): Payer: Medicare Other | Admitting: Obstetrics and Gynecology

## 2020-02-17 ENCOUNTER — Other Ambulatory Visit: Payer: Self-pay

## 2020-02-17 VITALS — BP 151/74 | HR 96 | Ht 64.0 in | Wt 121.2 lb

## 2020-02-17 DIAGNOSIS — Z4689 Encounter for fitting and adjustment of other specified devices: Secondary | ICD-10-CM

## 2020-02-17 DIAGNOSIS — N813 Complete uterovaginal prolapse: Secondary | ICD-10-CM

## 2020-02-17 DIAGNOSIS — N952 Postmenopausal atrophic vaginitis: Secondary | ICD-10-CM | POA: Diagnosis not present

## 2020-02-17 NOTE — Progress Notes (Signed)
    GYNECOLOGY CLINIC PROGRESS NOTE  Subjective:    Patient ID: Kimberly Berger, female    DOB: [birth year], 69 y.o.   MRN: [id number]  HPI  Patient is a 69 y.o. G36P0 female who presents today for pessary re-insertion. She currently has a Grade 3 uterine prolapse. She desired to take a break from her pessary due to discomfort and expulsion back in January 2021. Was previously using a Size 2 ring pessary.      The following portions of the patient's history were reviewed and updated as appropriate: allergies, current medications, past family history, past medical history, past social history, past surgical history and problem list.  Review of Systems Pertinent items noted in HPI and remainder of comprehensive ROS otherwise negative.   Objective:   Blood pressure (!) 151/74, pulse 96, height 5\' 4"  (1.626 m), weight 121 lb 3.2 oz (55 kg). General appearance: alert and no distress Abdomen: soft, non-tender; bowel sounds normal; no masses,  no organomegaly Pelvic:  External genitalia normal, no lesions. Speculum examination not performed.  Cervix protruding beyond vaginal introitus, pale, atrophic. Bimanual exam not performed.    Assessment:   1. Pessary maintenance 2. Grade 3 uterine prolapse 3. Vaginal atrophy  Plan:   - The patient desires to have pessary reinserted, but requests a smaller one. Discussed that the likelihood of expulsion would be greater if she downsized, however patient is adamant that she does not desire her previous pessary to be inserted.  A size 1 incontinence dish with knob was placed today. Patient denied any discomfort after placement.  Warned again of increased possibility of expulsion, patient states she is ok with this. Advised on resuming use of the Trimo-san gel.   - Vaginal atrophy not bothersome to patient.  - F/u in 3 months for pessary check.    , MD [facility]

## 2020-02-17 NOTE — Progress Notes (Signed)
Pt present for routine pessary check. Pt stated that she is doing well.

## 2020-03-16 ENCOUNTER — Other Ambulatory Visit: Payer: Self-pay

## 2020-03-16 ENCOUNTER — Encounter: Payer: Self-pay | Admitting: Obstetrics and Gynecology

## 2020-03-16 ENCOUNTER — Ambulatory Visit (INDEPENDENT_AMBULATORY_CARE_PROVIDER_SITE_OTHER): Payer: Medicare Other | Admitting: Obstetrics and Gynecology

## 2020-03-16 VITALS — BP 137/73 | HR 100 | Ht 64.0 in | Wt 120.8 lb

## 2020-03-16 DIAGNOSIS — N813 Complete uterovaginal prolapse: Secondary | ICD-10-CM | POA: Diagnosis not present

## 2020-03-16 DIAGNOSIS — Z4689 Encounter for fitting and adjustment of other specified devices: Secondary | ICD-10-CM | POA: Diagnosis not present

## 2020-03-16 NOTE — Progress Notes (Signed)
Pt present due to pessary fell out. Pt stated that she coughed and the pessary fell out.

## 2020-03-16 NOTE — Progress Notes (Signed)
    GYNECOLOGY CLINIC PROGRESS NOTE  Subjective:    Patient ID: Kimberly Berger, female    DOB: [birth year], 69 y.o.   MRN: [id number]  HPI  Patient is a 69 y.o. G78P0 female who presents today for complaints of pessary expulsion. She currently has a Grade 3 uterine prolapse. Previously using a Size 1 incontinence dish with knob (despite warnings that greatere chance of expulsion would occur due to smaller size but patient felt mor comfortable with this size). Was previously using a Size 2 Gelhorn pessary.      The following portions of the patient's history were reviewed and updated as appropriate: allergies, current medications, past family history, past medical history, past social history, past surgical history and problem list.  Review of Systems Pertinent items noted in HPI and remainder of comprehensive ROS otherwise negative.   Objective:   Blood pressure 137/73, pulse 100, height 5\' 4"  (1.626 m), weight 120 lb 12.8 oz (54.8 kg). General appearance: alert and no distress Abdomen: soft, non-tender; bowel sounds normal; no masses,  no organomegaly Pelvic:  External genitalia normal, no lesions. Speculum examination not performed.  Cervix protruding beyond vaginal introitus, pale, atrophic. Bimanual exam not performed. The patient had a previous pessary (Size 2 ring pessary) in office from a previous trial. Inserted this today as patient left her other pessary at home.    Assessment:   1. Pessary maintenance 2. Grade 3 uterine prolapse 3. Vaginal atrophy  Plan:   - Lengthy discussion had again regarding patient's pelvic organ prolapse.  She does not desire to increase the size of her pessary due to discomfort, however with current sizes she continues to note expulsion. Also has been very hesitant to broach the subject of surgery, however discussed this would be the only other alternative to doing nothing at all.  Attempted to address patient's fears and concerns regarding surgical  intervention. All questions answered. Patient states that if the currently inserted pessary does not work again, she will consider surgical intervention. Given handouts on vaginal hysterectomy as well as hysteropexy (would need to be performed at a tertiary facility like [facility] or perhaps [place] area).  -Advised on resuming use of the Trimo-san gel. Discussed ways to manage pessary if she feels it is falling before the point of expulsion. Also instructed on ways to reinsert the pessary if expelled, if desired.   - Vaginal atrophy not bothersome to patient.  - F/u in 3 months for pessary check. Follow up sooner if surgical management desired.   A total of 15 minutes were spent face-to-face with the patient during this encounter and over half of that time dealt with counseling and coordination of care.     [place], MD [facility]

## 2020-05-17 ENCOUNTER — Telehealth: Payer: Self-pay

## 2020-05-17 NOTE — Telephone Encounter (Signed)
Pt called to reschedule her appointment. Pt's appointment has been rescheduled.

## 2020-05-19 ENCOUNTER — Encounter: Payer: Medicare Other | Admitting: Obstetrics and Gynecology

## 2020-06-16 ENCOUNTER — Encounter: Payer: Medicare Other | Admitting: Obstetrics and Gynecology

## 2020-06-16 NOTE — Progress Notes (Deleted)
Pt present for pessary check. Pt stated  

## 2020-06-22 ENCOUNTER — Encounter: Payer: Self-pay | Admitting: Obstetrics and Gynecology

## 2020-06-22 ENCOUNTER — Ambulatory Visit (INDEPENDENT_AMBULATORY_CARE_PROVIDER_SITE_OTHER): Payer: Medicare Other | Admitting: Obstetrics and Gynecology

## 2020-06-22 ENCOUNTER — Other Ambulatory Visit: Payer: Self-pay

## 2020-06-22 VITALS — BP 145/67 | HR 92 | Ht 64.0 in | Wt 117.8 lb

## 2020-06-22 DIAGNOSIS — N813 Complete uterovaginal prolapse: Secondary | ICD-10-CM

## 2020-06-22 DIAGNOSIS — Z4689 Encounter for fitting and adjustment of other specified devices: Secondary | ICD-10-CM

## 2020-06-22 DIAGNOSIS — Z960 Presence of urogenital implants: Secondary | ICD-10-CM | POA: Insufficient documentation

## 2020-06-22 DIAGNOSIS — N952 Postmenopausal atrophic vaginitis: Secondary | ICD-10-CM

## 2020-06-22 NOTE — Progress Notes (Signed)
    GYNECOLOGY CLINIC PROGRESS NOTE  Subjective:    Patient ID: Kimberly Berger, female    DOB: 1951/07/10, 69 y.o.   MRN: [id number]  HPI  Patient is a 69 y.o. G55P0 female who presents today for complaints of pessary expulsion. She currently has a Grade 3 uterine prolapse. Currently denies any major issues. Sometimes notes more sporadic stream with urination but otherwise happy with current pessary as this is the first one that has remained in place.  She does sometimes feel as though the pessary is low and may fall out but it does not. Denies issues with bowel movements.    The following portions of the patient's history were reviewed and updated as appropriate: allergies, current medications, past family history, past medical history, past social history, past surgical history and problem list.  Review of Systems Pertinent items noted in HPI and remainder of comprehensive ROS otherwise negative.   Objective:   Blood pressure (!) 145/67, pulse 92, height 5\' 4"  (1.626 m), weight 117 lb 12.8 oz (53.4 kg). General appearance: alert and no distress Abdomen: soft, non-tender; bowel sounds normal; no masses,  no organomegaly Pelvic:  External genitalia normal, no lesions. Size 2 ring pessary removed, cleaned,and reinserted.  Speculum exam notes atrophic vagina, no lesions.    Assessment:   1. Pessary maintenance 2. Grade 3 uterine prolapse 3. Vaginal atrophy  Plan:   -  Can continue q 3 month pessary checks.  Can inform on how to remove and replace at home when ready.  - Advised on continuing use of the Trimo-san gel.   - Vaginal atrophy not bothersome to patient.  - F/u in 3 months for pessary check.     , MD [facility]

## 2020-06-22 NOTE — Progress Notes (Signed)
Pt present for pessary check. Pt stated that her pessary will not stay in place and is coming out.

## 2020-09-16 ENCOUNTER — Other Ambulatory Visit: Payer: Self-pay | Admitting: Family Medicine

## 2020-09-16 ENCOUNTER — Other Ambulatory Visit: Payer: Self-pay | Admitting: Primary Care

## 2020-09-16 DIAGNOSIS — Z1231 Encounter for screening mammogram for malignant neoplasm of breast: Secondary | ICD-10-CM

## 2020-09-21 ENCOUNTER — Encounter: Payer: Medicare Other | Admitting: Obstetrics and Gynecology

## 2020-09-30 ENCOUNTER — Ambulatory Visit (INDEPENDENT_AMBULATORY_CARE_PROVIDER_SITE_OTHER): Payer: Medicare Other | Admitting: Obstetrics and Gynecology

## 2020-09-30 ENCOUNTER — Encounter: Payer: Self-pay | Admitting: Obstetrics and Gynecology

## 2020-09-30 ENCOUNTER — Other Ambulatory Visit: Payer: Self-pay

## 2020-09-30 VITALS — BP 125/72 | HR 102 | Ht 64.0 in | Wt 120.1 lb

## 2020-09-30 DIAGNOSIS — Z4689 Encounter for fitting and adjustment of other specified devices: Secondary | ICD-10-CM

## 2020-09-30 NOTE — Progress Notes (Signed)
    GYNECOLOGY CLINIC PROGRESS NOTE  Subjective:    Patient ID: Kimberly Berger, female    DOB: 08-17-1951, 69 y.o.   MRN: [id number]  HPI  Patient is a 69 y.o. G75P0 female who presents today for pessary. She currently has a Grade 3 uterine prolapse. Currently denies any major issues.  She does sometimes feel as though the pessary is low and may fall out but it does not. Denies issues with bowel movements.    The following portions of the patient's history were reviewed and updated as appropriate: allergies, current medications, past family history, past medical history, past social history, past surgical history and problem list.  Review of Systems Pertinent items noted in HPI and remainder of comprehensive ROS otherwise negative.   Objective:   Blood pressure 125/72, pulse (!) 102, height 5\' 4"  (1.626 m), weight 120 lb 1.6 oz (54.5 kg). General appearance: alert and no distress Abdomen: soft, non-tender; bowel sounds normal; no masses,  no organomegaly Pelvic:  External genitalia normal, no lesions. Size 2 ring pessary removed, cleaned,and reinserted.  Speculum exam notes atrophic vagina, no lesions.    Assessment:   1. Pessary maintenance 2. Grade 3 uterine prolapse 3. Vaginal atrophy  Plan:   -  Can continue q 3 month pessary checks.  Patient desires to wait until next visit to learn to remove pessary herself.  - Advised on continuing use of the Trimo-san gel.   - Vaginal atrophy not bothersome to patient.  - F/u in 3 months for pessary check.     , MD [facility]

## 2020-09-30 NOTE — Progress Notes (Signed)
Pt present for pessary check. Pt stated that she was doing well other than her pessary has been sliding down and she has to keep pushing it back up.

## 2021-04-11 ENCOUNTER — Other Ambulatory Visit: Payer: Self-pay

## 2021-04-11 ENCOUNTER — Ambulatory Visit
Admission: RE | Admit: 2021-04-11 | Discharge: 2021-04-11 | Disposition: A | Discharge status: Home / Self Care | Payer: Medicare Other | Source: Ambulatory Visit | Attending: Primary Care | Admitting: Primary Care

## 2021-04-11 DIAGNOSIS — Z1231 Encounter for screening mammogram for malignant neoplasm of breast: Secondary | ICD-10-CM | POA: Insufficient documentation

## 2021-04-13 ENCOUNTER — Ambulatory Visit (INDEPENDENT_AMBULATORY_CARE_PROVIDER_SITE_OTHER): Payer: Medicare Other | Admitting: Obstetrics and Gynecology

## 2021-04-13 ENCOUNTER — Other Ambulatory Visit: Payer: Self-pay

## 2021-04-13 ENCOUNTER — Encounter: Payer: Self-pay | Admitting: Obstetrics and Gynecology

## 2021-04-13 VITALS — BP 128/70 | HR 94 | Ht 64.0 in | Wt 119.2 lb

## 2021-04-13 DIAGNOSIS — N813 Complete uterovaginal prolapse: Secondary | ICD-10-CM

## 2021-04-13 DIAGNOSIS — Z4689 Encounter for fitting and adjustment of other specified devices: Secondary | ICD-10-CM | POA: Diagnosis not present

## 2021-04-13 DIAGNOSIS — N952 Postmenopausal atrophic vaginitis: Secondary | ICD-10-CM

## 2021-04-13 NOTE — Progress Notes (Signed)
    GYNECOLOGY CLINIC PROGRESS NOTE  Subjective:    Patient ID: Kimberly Berger, female    DOB: [birth year], 70 y.o.   MRN: [id number]  HPI  Patient is a 70 y.o. G72P0 female who presents today for pessary check for Grade 3 uterine prolapse.  She manages the pessary herself.  Currently denies any major issues.  She does sometimes feel as though the pessary is low and may fall out but it does not. Denies issues with bowel movements.    The following portions of the patient's history were reviewed and updated as appropriate: allergies, current medications, past family history, past medical history, past social history, past surgical history and problem list.  Review of Systems Pertinent items noted in HPI and remainder of comprehensive ROS otherwise negative.   Objective:   Blood pressure 128/70, pulse 94, height 5\' 4"  (1.626 m), weight 119 lb 3.2 oz (54.1 kg). General appearance: alert and no distress Abdomen: soft, non-tender; bowel sounds normal; no masses,  no organomegaly Pelvic:  External genitalia normal, no lesions. Size 2 ring pessary removed, cleaned,and reinserted.  Speculum exam notes atrophic vagina, no lesions.    Assessment:   1. Pessary maintenance 2. Grade 3 uterine prolapse 3. Vaginal atrophy  Plan:   -  Can continue q 6 month pessary checks.   - Advised on continuing use of the Trimo-san gel.   - Vaginal atrophy not bothersome to patient.      , MD [facility]

## 2021-04-13 NOTE — Progress Notes (Signed)
Pt present for pessary check. Pt c/o lower abd area.

## 2021-04-14 ENCOUNTER — Encounter (INDEPENDENT_AMBULATORY_CARE_PROVIDER_SITE_OTHER): Payer: Self-pay | Admitting: Nurse Practitioner

## 2021-10-05 ENCOUNTER — Encounter: Payer: Medicare Other | Admitting: Obstetrics and Gynecology

## 2021-10-11 ENCOUNTER — Encounter: Payer: Medicare Other | Admitting: Obstetrics and Gynecology

## 2021-11-01 ENCOUNTER — Ambulatory Visit (INDEPENDENT_AMBULATORY_CARE_PROVIDER_SITE_OTHER): Payer: Medicare Other | Admitting: Obstetrics and Gynecology

## 2021-11-01 ENCOUNTER — Encounter: Payer: Self-pay | Admitting: Obstetrics and Gynecology

## 2021-11-01 ENCOUNTER — Other Ambulatory Visit: Payer: Self-pay

## 2021-11-01 VITALS — BP 158/74 | HR 90 | Ht 64.0 in | Wt 125.0 lb

## 2021-11-01 DIAGNOSIS — Z4689 Encounter for fitting and adjustment of other specified devices: Secondary | ICD-10-CM

## 2021-11-01 DIAGNOSIS — I10 Essential (primary) hypertension: Secondary | ICD-10-CM

## 2021-11-01 DIAGNOSIS — N952 Postmenopausal atrophic vaginitis: Secondary | ICD-10-CM

## 2021-11-01 DIAGNOSIS — N813 Complete uterovaginal prolapse: Secondary | ICD-10-CM | POA: Diagnosis not present

## 2021-11-01 NOTE — Progress Notes (Signed)
° ° °  GYNECOLOGY CLINIC PROGRESS NOTE  Subjective:    Patient ID: Kimberly Berger, female    DOB: 07-Jun-1951, 71 y.o.   MRN: [id number]  HPI  Patient is a 71 y.o. G74P0 female who presents today for pessary check for Grade 3 uterine prolapse.  She manages the pessary herself.  Currently denies any major issues.  Denies issues with bowel movements. Last visit was 6 months ago.    The following portions of the patient's history were reviewed and updated as appropriate: allergies, current medications, past family history, past medical history, past social history, past surgical history and problem list.  Review of Systems Pertinent items noted in HPI and remainder of comprehensive ROS otherwise negative.   Objective:   Blood pressure (!) 158/74, pulse 90, height 5\' 4"  (1.626 m), weight 125 lb (56.7 kg), SpO2 94 %. General appearance: alert and no distress Abdomen: soft, non-tender; bowel sounds normal; no masses,  no organomegaly Pelvic:  External genitalia normal, no lesions. Size 2 ring pessary removed, cleaned,and reinserted.  Speculum exam notes atrophic vagina, no lesions.    Assessment:   1. Pessary maintenance 2. Grade 3 uterine prolapse 3. Vaginal atrophy 4. HTN  Plan:   -  Can stretch to yearly visits as she is more comfortable managing her pessary.     - Advised on continuing use of the Trimo-san gel.   - Vaginal atrophy not bothersome to patient.  - HTN, patient notes she has not taken her BP pill yet today as she has been running around.  Also notes increased stress as her daughter possibly may have colon cancer. Advised to monitor BPs at home. Also has an appt with PCP next month.    Rubie Maid, MD [facility]

## 2022-03-15 ENCOUNTER — Encounter: Payer: Medicare Other | Admitting: Obstetrics and Gynecology

## 2022-03-20 ENCOUNTER — Encounter: Payer: Medicare Other | Admitting: Obstetrics and Gynecology

## 2022-03-30 ENCOUNTER — Other Ambulatory Visit: Payer: Self-pay | Admitting: Primary Care

## 2022-03-30 DIAGNOSIS — Z1231 Encounter for screening mammogram for malignant neoplasm of breast: Secondary | ICD-10-CM

## 2022-05-01 ENCOUNTER — Encounter: Payer: Medicare Other | Admitting: Obstetrics and Gynecology

## 2022-05-14 NOTE — Progress Notes (Deleted)
    GYNECOLOGY PROGRESS NOTE  Subjective:    Patient ID: Kimberly Berger, female    DOB: 02-03-1951, 71 y.o.   MRN: [id number]  HPI  Patient is a 71 y.o. G58P0 female who presents for Pessary Maintenance to check for Grade 3 uterine prolapse. She manages the pessary herself.  {Common ambulatory SmartLinks:19316}  Review of Systems {ros; complete:30496}   Objective:   There were no vitals taken for this visit. There is no height or weight on file to calculate BMI. General appearance: {general exam:16600} Abdomen: {abdominal exam:16834} Pelvic: {pelvic exam:16852::"cervix normal in appearance","external genitalia normal","no adnexal masses or tenderness","no cervical motion tenderness","rectovaginal septum normal","uterus normal size, shape, and consistency","vagina normal without discharge"} Extremities: {extremity exam:5109} Neurologic: {neuro exam:17854}   Assessment:   No diagnosis found.   Plan:   There are no diagnoses linked to this encounter.    Hildred Laser, MD [facility]

## 2022-05-15 ENCOUNTER — Encounter: Payer: Medicare Other | Admitting: Obstetrics and Gynecology

## 2022-05-23 NOTE — Progress Notes (Unsigned)
    GYNECOLOGY PROGRESS NOTE  Subjective:    Patient ID: Kimberly Berger, female    DOB: [birth year], 71 y.o.   MRN: [id number]  HPI  Patient is a 71 y.o. G65P0 female who presents for pessary maintenance Grade 3 uterine prolapse.  She manages the pessary herself.  {Common ambulatory SmartLinks:19316}  Review of Systems {ros; complete:30496}   Objective:   There were no vitals taken for this visit. There is no height or weight on file to calculate BMI. General appearance: {general exam:16600} Abdomen: {abdominal exam:16834} Pelvic: {pelvic exam:16852::"cervix normal in appearance","external genitalia normal","no adnexal masses or tenderness","no cervical motion tenderness","rectovaginal septum normal","uterus normal size, shape, and consistency","vagina normal without discharge"} Extremities: {extremity exam:5109} Neurologic: {neuro exam:17854}   Assessment:   No diagnosis found.   Plan:   There are no diagnoses linked to this encounter.    Hildred Laser, MD [facility]

## 2022-05-24 ENCOUNTER — Ambulatory Visit (INDEPENDENT_AMBULATORY_CARE_PROVIDER_SITE_OTHER): Payer: Medicare Other | Admitting: Obstetrics and Gynecology

## 2022-05-24 ENCOUNTER — Encounter: Payer: Self-pay | Admitting: Obstetrics and Gynecology

## 2022-05-24 VITALS — BP 138/62 | HR 88 | Ht 64.0 in | Wt 125.3 lb

## 2022-05-24 DIAGNOSIS — N813 Complete uterovaginal prolapse: Secondary | ICD-10-CM | POA: Diagnosis not present

## 2022-05-24 DIAGNOSIS — Z4689 Encounter for fitting and adjustment of other specified devices: Secondary | ICD-10-CM | POA: Diagnosis not present

## 2022-05-24 DIAGNOSIS — N952 Postmenopausal atrophic vaginitis: Secondary | ICD-10-CM

## 2022-06-30 DIAGNOSIS — R000 Tachycardia, unspecified: Secondary | ICD-10-CM | POA: Diagnosis present

## 2022-06-30 DIAGNOSIS — J441 Chronic obstructive pulmonary disease with (acute) exacerbation: Principal | ICD-10-CM | POA: Diagnosis present

## 2022-06-30 DIAGNOSIS — Z8249 Family history of ischemic heart disease and other diseases of the circulatory system: Secondary | ICD-10-CM

## 2022-06-30 DIAGNOSIS — Z7982 Long term (current) use of aspirin: Secondary | ICD-10-CM

## 2022-06-30 DIAGNOSIS — J9601 Acute respiratory failure with hypoxia: Secondary | ICD-10-CM | POA: Diagnosis present

## 2022-06-30 DIAGNOSIS — Z20822 Contact with and (suspected) exposure to covid-19: Secondary | ICD-10-CM | POA: Diagnosis present

## 2022-06-30 DIAGNOSIS — F17210 Nicotine dependence, cigarettes, uncomplicated: Secondary | ICD-10-CM | POA: Diagnosis present

## 2022-06-30 DIAGNOSIS — R0602 Shortness of breath: Secondary | ICD-10-CM | POA: Diagnosis not present

## 2022-06-30 DIAGNOSIS — I700 Atherosclerosis of aorta: Secondary | ICD-10-CM | POA: Diagnosis present

## 2022-06-30 DIAGNOSIS — I10 Essential (primary) hypertension: Secondary | ICD-10-CM | POA: Diagnosis present

## 2022-06-30 DIAGNOSIS — Z79899 Other long term (current) drug therapy: Secondary | ICD-10-CM

## 2022-06-30 DIAGNOSIS — D72829 Elevated white blood cell count, unspecified: Secondary | ICD-10-CM | POA: Diagnosis present

## 2022-06-30 NOTE — ED Triage Notes (Signed)
Patient reports shortness of breath and cough that started yesterday worse today. Short of breath worsens with movement.  Patient currently still smokes.

## 2022-07-01 ENCOUNTER — Inpatient Hospital Stay
Admission: EM | Admit: 2022-07-01 | Discharge: 2022-07-03 | DRG: 190 | Disposition: A | Discharge status: Home / Self Care | Payer: Medicare Other | Attending: Internal Medicine | Admitting: Internal Medicine

## 2022-07-01 ENCOUNTER — Emergency Department: Payer: Medicare Other

## 2022-07-01 ENCOUNTER — Encounter: Payer: Self-pay | Admitting: Internal Medicine

## 2022-07-01 ENCOUNTER — Other Ambulatory Visit: Payer: Self-pay

## 2022-07-01 DIAGNOSIS — Z79899 Other long term (current) drug therapy: Secondary | ICD-10-CM | POA: Diagnosis not present

## 2022-07-01 DIAGNOSIS — J9601 Acute respiratory failure with hypoxia: Secondary | ICD-10-CM | POA: Insufficient documentation

## 2022-07-01 DIAGNOSIS — D72829 Elevated white blood cell count, unspecified: Secondary | ICD-10-CM

## 2022-07-01 DIAGNOSIS — R0602 Shortness of breath: Secondary | ICD-10-CM | POA: Diagnosis present

## 2022-07-01 DIAGNOSIS — Z20822 Contact with and (suspected) exposure to covid-19: Secondary | ICD-10-CM | POA: Diagnosis present

## 2022-07-01 DIAGNOSIS — F17210 Nicotine dependence, cigarettes, uncomplicated: Secondary | ICD-10-CM | POA: Diagnosis present

## 2022-07-01 DIAGNOSIS — J441 Chronic obstructive pulmonary disease with (acute) exacerbation: Secondary | ICD-10-CM | POA: Diagnosis present

## 2022-07-01 DIAGNOSIS — I10 Essential (primary) hypertension: Secondary | ICD-10-CM | POA: Diagnosis present

## 2022-07-01 DIAGNOSIS — Z720 Tobacco use: Secondary | ICD-10-CM | POA: Diagnosis present

## 2022-07-01 DIAGNOSIS — Z8249 Family history of ischemic heart disease and other diseases of the circulatory system: Secondary | ICD-10-CM | POA: Diagnosis not present

## 2022-07-01 DIAGNOSIS — Z7982 Long term (current) use of aspirin: Secondary | ICD-10-CM | POA: Diagnosis not present

## 2022-07-01 DIAGNOSIS — I700 Atherosclerosis of aorta: Secondary | ICD-10-CM | POA: Diagnosis present

## 2022-07-01 DIAGNOSIS — R000 Tachycardia, unspecified: Secondary | ICD-10-CM | POA: Diagnosis present

## 2022-07-01 LAB — SARS CORONAVIRUS 2 BY RT PCR: SARS Coronavirus 2 by RT PCR: NEGATIVE

## 2022-07-01 LAB — CBC WITH DIFFERENTIAL/PLATELET
Abs Immature Granulocytes: 0.05 10*3/uL (ref 0.00–0.07)
Basophils Absolute: 0 10*3/uL (ref 0.0–0.1)
Basophils Relative: 0 %
Eosinophils Absolute: 0.1 10*3/uL (ref 0.0–0.5)
Eosinophils Relative: 0 %
HCT: 39.5 % (ref 36.0–46.0)
Hemoglobin: 13 g/dL (ref 12.0–15.0)
Immature Granulocytes: 0 %
Lymphocytes Relative: 11 %
Lymphs Abs: 1.6 10*3/uL (ref 0.7–4.0)
MCH: 32 pg (ref 26.0–34.0)
MCHC: 32.9 g/dL (ref 30.0–36.0)
MCV: 97.3 fL (ref 80.0–100.0)
Monocytes Absolute: 0.6 10*3/uL (ref 0.1–1.0)
Monocytes Relative: 4 %
Neutro Abs: 12.4 10*3/uL — ABNORMAL HIGH (ref 1.7–7.7)
Neutrophils Relative %: 85 %
Platelets: 244 10*3/uL (ref 150–400)
RBC: 4.06 MIL/uL (ref 3.87–5.11)
RDW: 12.7 % (ref 11.5–15.5)
WBC: 14.7 10*3/uL — ABNORMAL HIGH (ref 4.0–10.5)
nRBC: 0 % (ref 0.0–0.2)

## 2022-07-01 LAB — BRAIN NATRIURETIC PEPTIDE: B Natriuretic Peptide: 29.8 pg/mL (ref 0.0–100.0)

## 2022-07-01 LAB — HIV ANTIBODY (ROUTINE TESTING W REFLEX): HIV Screen 4th Generation wRfx: NONREACTIVE

## 2022-07-01 LAB — COMPREHENSIVE METABOLIC PANEL
ALT: 18 U/L (ref 0–44)
AST: 25 U/L (ref 15–41)
Albumin: 3.9 g/dL (ref 3.5–5.0)
Alkaline Phosphatase: 92 U/L (ref 38–126)
Anion gap: 7 (ref 5–15)
BUN: 14 mg/dL (ref 8–23)
CO2: 29 mmol/L (ref 22–32)
Calcium: 8.8 mg/dL — ABNORMAL LOW (ref 8.9–10.3)
Chloride: 103 mmol/L (ref 98–111)
Creatinine, Ser: 0.78 mg/dL (ref 0.44–1.00)
GFR, Estimated: >60 mL/min (ref >60)
Glucose, Bld: 125 mg/dL — ABNORMAL HIGH (ref 70–99)
Potassium: 3.6 mmol/L (ref 3.5–5.1)
Sodium: 139 mmol/L (ref 135–145)
Total Bilirubin: 1 mg/dL (ref 0.3–1.2)
Total Protein: 8.2 g/dL — ABNORMAL HIGH (ref 6.5–8.1)

## 2022-07-01 LAB — EXPECTORATED SPUTUM ASSESSMENT W GRAM STAIN, RFLX TO RESP C

## 2022-07-01 LAB — PROCALCITONIN: Procalcitonin: 0.16 ng/mL

## 2022-07-01 LAB — TROPONIN I (HIGH SENSITIVITY)
Troponin I (High Sensitivity): 11 ng/L (ref <18)
Troponin I (High Sensitivity): 7 ng/L (ref <18)

## 2022-07-01 MED ORDER — SODIUM CHLORIDE 0.9 % IV SOLN
1.0000 g | INTRAVENOUS | Status: DC
  Administered 2022-07-01: 1 g via INTRAVENOUS
  Filled 2022-07-01: qty 10

## 2022-07-01 MED ORDER — IPRATROPIUM-ALBUTEROL 0.5-2.5 (3) MG/3ML IN SOLN
3.0000 mL | Freq: Four times a day (QID) | RESPIRATORY_TRACT | Status: DC
  Administered 2022-07-01 – 2022-07-02 (×5): 3 mL via RESPIRATORY_TRACT
  Filled 2022-07-01 (×5): qty 3

## 2022-07-01 MED ORDER — AZITHROMYCIN 500 MG PO TABS
500.0000 mg | ORAL_TABLET | Freq: Once | ORAL | Status: AC
  Administered 2022-07-01: 500 mg via ORAL
  Filled 2022-07-01: qty 1

## 2022-07-01 MED ORDER — MECLIZINE HCL 25 MG PO TABS
25.0000 mg | ORAL_TABLET | Freq: Three times a day (TID) | ORAL | Status: DC | PRN
  Administered 2022-07-01 – 2022-07-03 (×3): 25 mg via ORAL
  Filled 2022-07-01 (×4): qty 1

## 2022-07-01 MED ORDER — PREDNISONE 20 MG PO TABS
60.0000 mg | ORAL_TABLET | Freq: Once | ORAL | Status: AC
  Administered 2022-07-01: 60 mg via ORAL
  Filled 2022-07-01: qty 3

## 2022-07-01 MED ORDER — LACTATED RINGERS IV BOLUS
1000.0000 mL | Freq: Once | INTRAVENOUS | Status: AC
  Administered 2022-07-01: 1000 mL via INTRAVENOUS

## 2022-07-01 MED ORDER — ACETAMINOPHEN 650 MG RE SUPP: 650.0000 mg | Freq: Four times a day (QID) | RECTAL | Status: DC | PRN

## 2022-07-01 MED ORDER — ACETAMINOPHEN 325 MG PO TABS: 650.0000 mg | ORAL_TABLET | Freq: Four times a day (QID) | ORAL | Status: DC | PRN

## 2022-07-01 MED ORDER — AMLODIPINE BESYLATE 5 MG PO TABS
5.0000 mg | ORAL_TABLET | Freq: Every day | ORAL | Status: DC
  Administered 2022-07-01 – 2022-07-03 (×3): 5 mg via ORAL
  Filled 2022-07-01 (×3): qty 1

## 2022-07-01 MED ORDER — ENOXAPARIN SODIUM 40 MG/0.4ML IJ SOSY
40.0000 mg | PREFILLED_SYRINGE | INTRAMUSCULAR | Status: DC
  Administered 2022-07-03: 40 mg via SUBCUTANEOUS
  Filled 2022-07-01 (×3): qty 0.4

## 2022-07-01 MED ORDER — ONDANSETRON HCL 4 MG/2ML IJ SOLN: 4.0000 mg | Freq: Four times a day (QID) | INTRAMUSCULAR | Status: DC | PRN

## 2022-07-01 MED ORDER — HYDROCODONE-ACETAMINOPHEN 5-325 MG PO TABS: 1.0000 | ORAL_TABLET | ORAL | Status: DC | PRN

## 2022-07-01 MED ORDER — ALBUTEROL SULFATE (2.5 MG/3ML) 0.083% IN NEBU
2.5000 mg | INHALATION_SOLUTION | Freq: Once | RESPIRATORY_TRACT | Status: AC
  Administered 2022-07-01: 2.5 mg via RESPIRATORY_TRACT
  Filled 2022-07-01: qty 3

## 2022-07-01 MED ORDER — ALBUTEROL SULFATE (2.5 MG/3ML) 0.083% IN NEBU: 2.5000 mg | INHALATION_SOLUTION | RESPIRATORY_TRACT | Status: DC | PRN

## 2022-07-01 MED ORDER — IPRATROPIUM-ALBUTEROL 0.5-2.5 (3) MG/3ML IN SOLN
3.0000 mL | Freq: Once | RESPIRATORY_TRACT | Status: AC
  Administered 2022-07-01: 3 mL via RESPIRATORY_TRACT
  Filled 2022-07-01: qty 3

## 2022-07-01 MED ORDER — SODIUM CHLORIDE 0.9 % IV SOLN: 500.0000 mg | INTRAVENOUS | Status: DC

## 2022-07-01 MED ORDER — PREDNISONE 20 MG PO TABS
40.0000 mg | ORAL_TABLET | Freq: Every day | ORAL | Status: DC
  Administered 2022-07-02 – 2022-07-03 (×2): 40 mg via ORAL
  Filled 2022-07-01 (×2): qty 2

## 2022-07-01 MED ORDER — ASPIRIN 325 MG PO TBEC
325.0000 mg | DELAYED_RELEASE_TABLET | Freq: Every day | ORAL | Status: DC
  Administered 2022-07-01 – 2022-07-03 (×3): 325 mg via ORAL
  Filled 2022-07-01 (×3): qty 1

## 2022-07-01 MED ORDER — ONDANSETRON HCL 4 MG PO TABS: 4.0000 mg | ORAL_TABLET | Freq: Four times a day (QID) | ORAL | Status: DC | PRN

## 2022-07-01 MED ORDER — METHYLPREDNISOLONE SODIUM SUCC 40 MG IJ SOLR
40.0000 mg | Freq: Two times a day (BID) | INTRAMUSCULAR | Status: AC
  Administered 2022-07-01 – 2022-07-02 (×2): 40 mg via INTRAVENOUS
  Filled 2022-07-01 (×2): qty 1

## 2022-07-01 MED ORDER — AZITHROMYCIN 250 MG PO TABS
500.0000 mg | ORAL_TABLET | Freq: Every day | ORAL | Status: DC
  Administered 2022-07-02 – 2022-07-03 (×2): 500 mg via ORAL
  Filled 2022-07-01 (×2): qty 2

## 2022-07-01 NOTE — Progress Notes (Signed)
PROGRESS NOTE    Kimberly Berger  BOF:[id number] DOB: [birth year] DOA: 07/01/2022 PCP: Sandrea Hughs, NP   Brief Narrative:  Kimberly Berger is a 71 y.o. female with medical history significant for HTN and COPD on Spiriva who presents to the ED with a 2-day history of cough productive of yellow phlegm and wheezing with dyspnea on exertion.  ED course: Persistently tachycardic to 124 and tachypneic to 22-28 with O2 sat 90% on room air dropping to 88 with minimal exertion requiring 2 L to maintain sats in the mid 90s.  Labs significant for WBC 14,000, troponin 7 and BNP 29.8.  COVID-negative. EKG, personally reviewed and interpreted showing sinus tachycardia at 121 with no acute ST-T wave changes Chest x-ray showing pulmonary edema versus bronchitic changes. Patient treated with an IV fluid bolus, prednisone, several DuoNebs and started on Zithromax.  Hospitalist consulted for admission after she continued to be tachycardic and tachypneic and with continued wheezing in spite of treatment.   Assessment & Plan:   Acute hypoxemic respiratory failure in the setting of COPD exacerbation: -Presented with increased work of breathing, tachypneic, tachycardic, oxygen saturation 88% requiring 2 L of oxygen via nasal cannula.  Current smoker -COVID-negative, BNP: WNL.  Troponin negative.  EKG no acute ischemic changes. -Chest x-ray shows pulmonary edema versus bronchitis changes. -Current continue DuoNebs, steroids,  and azithromycin.  Discontinue Rocephin. -We will try to wean off of oxygen on room air as tolerated -Spirometry ordered -Monitor vitals.  Hypertension: Blood pressure is improving.  Continue home medicine amlodipine once daily.  Leukocytosis: -Continue current antibiotics -Repeat CBC tomorrow AM.  She is currently afebrile.  Tobacco abuse: Counseled about cessation  Aortic atherosclerosis: Noted on chest x-ray  DVT prophylaxis: Lovenox Code Status: Full code Family Communication:   None present at bedside.  Plan of care discussed with patient in length and she verbalized understanding and agreed with it. Disposition Plan: Home in 1 to 2 days  Consultants:  None  Procedures:  None  Antimicrobials:  Rocephin and azithromycin  Status is: Observation    Subjective: Patient seen and examined.  Reports some improvement in her breathing.  No fever, chills.  Still wheezing.  Objective: Vitals:   07/01/22 0318 07/01/22 0347 07/01/22 0734 07/01/22 0737  BP:  (!) 142/66 122/66   Pulse:  (!) 110 91   Resp: 17 16 16    Temp:  98.7 F (37.1 C) 97.9 F (36.6 C)   TempSrc:  Oral Oral   SpO2: 94% 93% 92% 95%  Weight:  56.7 kg    Height:  5\' 4"  (1.626 m)      Intake/Output Summary (Last 24 hours) at 07/01/2022 0911 Last data filed at 07/01/2022 0400 Gross per 24 hour  Intake 1000 ml  Output --  Net 1000 ml   Filed Weights   07/01/22 0000 07/01/22 0347  Weight: 54.9 kg 56.7 kg    Examination:  General exam: Appears calm and comfortable on nasal cannula communicating well Respiratory system: Bilateral diffuse expiratory wheezing positive  cardiovascular system: S1 & S2 heard, RRR. No JVD, murmurs, rubs, gallops or clicks. No pedal edema. Gastrointestinal system: Abdomen is nondistended, soft and nontender. No organomegaly or masses felt. Normal bowel sounds heard. Central nervous system: Alert and oriented. No focal neurological deficits. Extremities: Symmetric 5 x 5 power. Skin: No rashes, lesions or ulcers Psychiatry: Judgement and insight appear normal. Mood & affect appropriate.    Data Reviewed: I have personally reviewed following labs and imaging studies  CBC: Recent Labs  Lab 07/01/22 0027  WBC 14.7*  NEUTROABS 12.4*  HGB 13.0  HCT 39.5  MCV 97.3  PLT 244   Basic Metabolic Panel: Recent Labs  Lab 07/01/22 0027  NA 139  K 3.6  CL 103  CO2 29  GLUCOSE 125*  BUN 14  CREATININE 0.78  CALCIUM 8.8*   GFR: Estimated Creatinine  Clearance: 57.3 mL/min (by C-G formula based on SCr of 0.78 mg/dL). Liver Function Tests: Recent Labs  Lab 07/01/22 0027  AST 25  ALT 18  ALKPHOS 92  BILITOT 1.0  PROT 8.2*  ALBUMIN 3.9   No results for input(s): "LIPASE", "AMYLASE" in the last 168 hours. No results for input(s): "AMMONIA" in the last 168 hours. Coagulation Profile: No results for input(s): "INR", "PROTIME" in the last 168 hours. Cardiac Enzymes: No results for input(s): "CKTOTAL", "CKMB", "CKMBINDEX", "TROPONINI" in the last 168 hours. BNP (last 3 results) No results for input(s): "PROBNP" in the last 8760 hours. HbA1C: No results for input(s): "HGBA1C" in the last 72 hours. CBG: No results for input(s): "GLUCAP" in the last 168 hours. Lipid Profile: No results for input(s): "CHOL", "HDL", "LDLCALC", "TRIG", "CHOLHDL", "LDLDIRECT" in the last 72 hours. Thyroid Function Tests: No results for input(s): "TSH", "T4TOTAL", "FREET4", "T3FREE", "THYROIDAB" in the last 72 hours. Anemia Panel: No results for input(s): "VITAMINB12", "FOLATE", "FERRITIN", "TIBC", "IRON", "RETICCTPCT" in the last 72 hours. Sepsis Labs: No results for input(s): "PROCALCITON", "LATICACIDVEN" in the last 168 hours.  Recent Results (from the past 240 hour(s))  SARS Coronavirus 2 by RT PCR (hospital order, performed in [facility] hospital lab) *cepheid single result test* Anterior Nasal Swab     Status: None   Collection Time: 07/01/22  1:17 AM   Specimen: Anterior Nasal Swab  Result Value Ref Range Status   SARS Coronavirus 2 by RT PCR NEGATIVE NEGATIVE Final    Comment: (NOTE) SARS-CoV-2 target nucleic acids are NOT DETECTED.  The SARS-CoV-2 RNA is generally detectable in upper and lower respiratory specimens during the acute phase of infection. The lowest concentration of SARS-CoV-2 viral copies this assay can detect is 250 copies / mL. A negative result does not preclude SARS-CoV-2 infection and should not be used as the sole basis  for treatment or other patient management decisions.  A negative result may occur with improper specimen collection / handling, submission of specimen other than nasopharyngeal swab, presence of viral mutation(s) within the areas targeted by this assay, and inadequate number of viral copies (<250 copies / mL). A negative result must be combined with clinical observations, patient history, and epidemiological information.  Fact Sheet for Patients:   RoadLapTop.co.za  Fact Sheet for Healthcare Providers: http://kim-miller.com/  This test is not yet approved or  cleared by the [place] FDA and has been authorized for detection and/or diagnosis of SARS-CoV-2 by FDA under an Emergency Use Authorization (EUA).  This EUA will remain in effect (meaning this test can be used) for the duration of the COVID-19 declaration under Section 564(b)(1) of the Act, 21 U.S.C. section 360bbb-3(b)(1), unless the authorization is terminated or revoked sooner.  Performed at [facility], [address] [place], [place] [address]   Expectorated Sputum Assessment w Gram Stain, Rflx to Resp Cult     Status: None   Collection Time: 07/01/22  6:44 AM   Specimen: Expectorated Sputum  Result Value Ref Range Status   Specimen Description EXPECTORATED SPUTUM  Final   Special Requests NONE  Final  Sputum evaluation   Final    THIS SPECIMEN IS ACCEPTABLE FOR SPUTUM CULTURE Performed at [facility], [address] [place], [place] [address]    Report Status 07/01/2022 FINAL  Final      Radiology Studies: DG Chest 2 View  Result Date: 07/01/2022 CLINICAL DATA:  short of breath EXAM: CHEST - 2 VIEW COMPARISON:  Chest x-ray 09/10/2018 FINDINGS: The heart and mediastinal contours are unchanged. Prominence of the hilar vasculature. Atherosclerotic plaque. No focal consolidation. Chronic coarsened interstitial markings with increased  interstitial markings. No pleural effusion. No pneumothorax. No acute osseous abnormality. IMPRESSION: 1. Pulmonary edema versus bronchitic changes. 2. Aortic Atherosclerosis (ICD10-I70.0) and Emphysema (ICD10-J43.9). Electronically Signed   By: Iven Finn M.D.   On: 07/01/2022 01:13    Scheduled Meds:  [START ON 07/02/2022] azithromycin  500 mg Oral Daily   enoxaparin (LOVENOX) injection  40 mg Subcutaneous Q24H   ipratropium-albuterol  3 mL Nebulization Q6H   methylPREDNISolone (SOLU-MEDROL) injection  40 mg Intravenous Q12H   Followed by   Derrill Memo ON 07/02/2022] predniSONE  40 mg Oral Q breakfast   Continuous Infusions:  cefTRIAXone (ROCEPHIN)  IV 1 g (07/01/22 0641)     LOS: 0 days   Time spent: 35 minutes   Mateya Torti Loann Quill, MD [facility]  If 7PM-7AM, please contact night-coverage [email or web address] 07/01/2022, 9:11 AM

## 2022-07-01 NOTE — ED Notes (Signed)
Pt c/o SOB that started yesterday and gotten worse today. Pt states her SOB increases with exertion. Pt states she has a hx of COPD. Pt denies chest pain at this time. Pt talking in complete sentences.

## 2022-07-01 NOTE — Progress Notes (Signed)
PHARMACIST - PHYSICIAN COMMUNICATION  CONCERNING: Antibiotic IV to Oral Route Change Policy  RECOMMENDATION: This patient is receiving azithromycin by the intravenous route.  Based on criteria approved by the Pharmacy and Therapeutics Committee, the antibiotic(s) is/are being converted to the equivalent oral dose form(s).   DESCRIPTION: These criteria include: Patient being treated for a respiratory tract infection, urinary tract infection, cellulitis or clostridium difficile associated diarrhea if on metronidazole The patient is not neutropenic and does not exhibit a GI malabsorption state The patient is eating (either orally or via tube) and/or has been taking other orally administered medications for a least 24 hours The patient is improving clinically and has a Tmax < 100.5  If you have questions about this conversion, please contact the [place]  07/01/22

## 2022-07-01 NOTE — Assessment & Plan Note (Signed)
Continue amlodipine ?

## 2022-07-01 NOTE — ED Provider Notes (Signed)
[facility] Provider Note    Event Date/Time   First MD Initiated Contact with Patient 07/01/22 0009     (approximate)   History   Shortness of Breath   HPI  Kimberly Berger is a 71 y.o. female with past medical history of hypertension and COPD who presents with shortness of breath and cough.  Symptoms been going on for the past 2 days.  She endorses cough that is productive of thick yellow sputum.  Feels short of breath with walking around but not at rest.  Denies chest pain.  Denies fevers or chills nausea vomiting or diarrhea.  She does have decreased appetite.  No sick contacts.  Tells me she has "a touch" of COPD.  She is on Spiriva.  Has never had to be admitted for her COPD.     Past Medical History:  Diagnosis Date  . Hypertension 12/12/2019  . No pertinent past medical history     Patient Active Problem List   Diagnosis Date Noted  . Vaginal pessary in situ 06/22/2020  . Vaginal atrophy 12/19/2018  . Third degree uterine prolapse 12/19/2018     Physical Exam  Triage Vital Signs: ED Triage Vitals  Enc Vitals Group     BP 07/01/22 0005 (!) 153/66     Pulse Rate 07/01/22 0005 (!) 124     Resp 07/01/22 0005 (!) 28     Temp 07/01/22 0005 98.9 F (37.2 C)     Temp Source 07/01/22 0005 Oral     SpO2 07/01/22 0005 90 %     Weight 07/01/22 0000 121 lb (54.9 kg)     Height 07/01/22 0000 5\' 4"  (1.626 m)     Head Circumference --      Peak Flow --      Pain Score 06/30/22 2359 0     Pain Loc --      Pain Edu? --      Excl. in [place]? --     Most recent vital signs: Vitals:   07/01/22 0005  BP: (!) 153/66  Pulse: (!) 124  Resp: (!) 28  Temp: 98.9 F (37.2 C)  SpO2: 90%     General: Awake, no distress.  CV:  Good peripheral perfusion.  No peripheral edema Resp:  Normal effort.  Patient is speaking in full sentences not in respiratory distress does have expiratory wheezing throughout with moderately decreased air movement Abd:  No  distention.  Neuro:             Awake, Alert, Oriented x 3  Other:     ED Results / Procedures / Treatments  Labs (all labs ordered are listed, but only abnormal results are displayed) Labs Reviewed  CBC WITH DIFFERENTIAL/PLATELET - Abnormal; Notable for the following components:      Result Value   WBC 14.7 (*)    Neutro Abs 12.4 (*)    All other components within normal limits  SARS CORONAVIRUS 2 BY RT PCR  COMPREHENSIVE METABOLIC PANEL  TROPONIN I (HIGH SENSITIVITY)     EKG EKG reviewed interpreted myself shows sinus tachycardia normal axis normal intervals no acute ischemic changes   RADIOLOGY Chest x-ray reviewed interpreted myself shows no infiltrate   PROCEDURES:  Critical Care performed: No  .1-3 Lead EKG Interpretation  Performed by: Rada Hay, MD Authorized by: Rada Hay, MD     ECG rate assessment: tachycardic     Rhythm: sinus tachycardia  Ectopy: none     Conduction: normal     The patient is on the cardiac monitor to evaluate for evidence of arrhythmia and/or significant heart rate changes.   MEDICATIONS ORDERED IN ED: Medications  lactated ringers bolus 1,000 mL (has no administration in time range)  ipratropium-albuterol (DUONEB) 0.5-2.5 (3) MG/3ML nebulizer solution 3 mL (has no administration in time range)  ipratropium-albuterol (DUONEB) 0.5-2.5 (3) MG/3ML nebulizer solution 3 mL (has no administration in time range)  ipratropium-albuterol (DUONEB) 0.5-2.5 (3) MG/3ML nebulizer solution 3 mL (has no administration in time range)  predniSONE (DELTASONE) tablet 60 mg (has no administration in time range)     IMPRESSION / MDM / ASSESSMENT AND PLAN / ED COURSE  I reviewed the triage vital signs and the nursing notes.                              Patient's presentation is most consistent with acute presentation with potential threat to life or bodily function.  Differential diagnosis includes, but is not limited to, COPD  exacerbation, viral illness, bacterial pneumonia, pulmonary embolism, acute coronary syndrome  Patient is 71 year old female who presents with cough productive of yellow sputum shortness of breath x2 days.  Initial sat is 90% and she is tachypneic and tachycardic.  My evaluation she is satting 99% on room air.  She does not have any increased work of breathing but does have wheezing throughout.  Does not appear volume overloaded.  She denies chest pain but has had thick productive yellow cough and decreased appetite.  Suspect COPD exacerbation possible bronchitis versus pneumonia.  Will obtain chest x-ray.  We will give a bolus DuoNebs and prednisone.  We will continue to monitor.  Patient's labs show leukocytosis of 14, troponin is negative.  B MP is reassuring.  After DuoNebs prednisone and a liter of fluid patient walked to the bathroom and became quite short of breath.  She is now on 2 L nasal cannula.  On repeat lung exam she continues to have significant wheezing.  Will give additional bronchodilators.  She will require admission.  COVID test was negative.  Clinical Course as of 07/01/22 Jack Quarto Jul 01, 2022  0212 ECG Heart Rate(!): 105 [KM]    Clinical Course User Index [KM] Rada Hay, MD     FINAL CLINICAL IMPRESSION(S) / ED DIAGNOSES   Final diagnoses:  None     Rx / DC Orders   ED Discharge Orders     None        Note:  This document was prepared using Dragon voice recognition software and may include unintentional dictation errors.   Rada Hay, MD 07/01/22 [phone]

## 2022-07-01 NOTE — H&P (Signed)
History and Physical    Patient: Kimberly Berger UVO:[id number] DOB: Dec 27, 1950 DOA: 07/01/2022 DOS: the patient was seen and examined on 07/01/2022 PCP: Sandrea Hughs, NP  Patient coming from: Home  Chief Complaint:  Chief Complaint  Patient presents with   Shortness of Breath    HPI: Kimberly Berger is a 71 y.o. female with medical history significant for HTN and COPD on Spiriva who presents to the ED with a 2-day history of cough productive of yellow phlegm and wheezing with dyspnea on exertion.  She denies chest pain, fever or chills.  Denies affected contacts. ED course and data review: Persistently tachycardic to 124 and tachypneic to 22-28 with O2 sat 90% on room air dropping to 88 with minimal exertion requiring 2 L to maintain sats in the mid 90s.  Labs significant for WBC 14,000, troponin 7 and BNP 29.8.  COVID-negative. EKG, personally reviewed and interpreted showing sinus tachycardia at 121 with no acute ST-T wave changes Chest x-ray showing pulmonary edema versus bronchitic changes. Patient treated with an IV fluid bolus, prednisone, several DuoNebs and started on Zithromax.  Hospitalist consulted for admission after she continued to be tachycardic and tachypneic and with continued wheezing in spite of treatment.   Review of Systems: As mentioned in the history of present illness. All other systems reviewed and are negative.  Past Medical History:  Diagnosis Date   Hypertension 12/12/2019   No pertinent past medical history    Past Surgical History:  Procedure Laterality Date   no surgery history     Social History:  reports that she has been smoking cigarettes. She has been smoking an average of .5 packs per day. She has never used smokeless tobacco. She reports that she does not drink alcohol and does not use drugs.  No Known Allergies  Family History  Problem Relation Age of Onset   Hypertension Father    Breast cancer Maternal Aunt     Prior to Admission  medications   Medication Sig Start Date End Date Taking? Authorizing Provider  amLODipine (NORVASC) 5 MG tablet Take 5 mg by mouth daily. 07/27/21   [provider]  aspirin 325 MG EC tablet Take 325 mg by mouth daily.    [provider]  ibuprofen (ADVIL,MOTRIN) 600 MG tablet Take 1 tablet (600 mg total) by mouth every 8 (eight) hours as needed for moderate pain (with food). 08/04/15   Rockne Menghini, MD  meclizine (ANTIVERT) 25 MG tablet Take 1 tablet (25 mg total) by mouth 3 (three) times daily as needed for dizziness. 12/12/19   Minna Antis, MD    Physical Exam: Vitals:   07/01/22 0200 07/01/22 0230 07/01/22 0300 07/01/22 0302  BP: (!) 150/70  136/62   Pulse: 61 (!) 116 (!) 111 (!) 110  Resp: (!) 27 (!) 26 (!) 25 (!) 22  Temp:      TempSrc:      SpO2: 100% 100% 98% 98%  Weight:      Height:       Physical Exam Vitals and nursing note reviewed.  Constitutional:      General: She is not in acute distress. HENT:     Head: Normocephalic and atraumatic.  Cardiovascular:     Rate and Rhythm: Regular rhythm. Tachycardia present.     Heart sounds: Normal heart sounds.  Pulmonary:     Effort: Tachypnea present.     Breath sounds: Wheezing and rhonchi present.  Abdominal:     Palpations: Abdomen  is soft.     Tenderness: There is no abdominal tenderness.  Neurological:     Mental Status: Mental status is at baseline.     Labs on Admission: I have personally reviewed following labs and imaging studies  CBC: Recent Labs  Lab 07/01/22 0027  WBC 14.7*  NEUTROABS 12.4*  HGB 13.0  HCT 39.5  MCV 97.3  PLT 244   Basic Metabolic Panel: Recent Labs  Lab 07/01/22 0027  NA 139  K 3.6  CL 103  CO2 29  GLUCOSE 125*  BUN 14  CREATININE 0.78  CALCIUM 8.8*   GFR: Estimated Creatinine Clearance: 57.3 mL/min (by C-G formula based on SCr of 0.78 mg/dL). Liver Function Tests: Recent Labs  Lab 07/01/22 0027  AST 25  ALT 18  ALKPHOS 92   BILITOT 1.0  PROT 8.2*  ALBUMIN 3.9   No results for input(s): "LIPASE", "AMYLASE" in the last 168 hours. No results for input(s): "AMMONIA" in the last 168 hours. Coagulation Profile: No results for input(s): "INR", "PROTIME" in the last 168 hours. Cardiac Enzymes: No results for input(s): "CKTOTAL", "CKMB", "CKMBINDEX", "TROPONINI" in the last 168 hours. BNP (last 3 results) No results for input(s): "PROBNP" in the last 8760 hours. HbA1C: No results for input(s): "HGBA1C" in the last 72 hours. CBG: No results for input(s): "GLUCAP" in the last 168 hours. Lipid Profile: No results for input(s): "CHOL", "HDL", "LDLCALC", "TRIG", "CHOLHDL", "LDLDIRECT" in the last 72 hours. Thyroid Function Tests: No results for input(s): "TSH", "T4TOTAL", "FREET4", "T3FREE", "THYROIDAB" in the last 72 hours. Anemia Panel: No results for input(s): "VITAMINB12", "FOLATE", "FERRITIN", "TIBC", "IRON", "RETICCTPCT" in the last 72 hours. Urine analysis:    Component Value Date/Time   COLORURINE YELLOW (A) 12/12/2019 1037   APPEARANCEUR HAZY (A) 12/12/2019 1037   APPEARANCEUR Cloudy 10/06/2012 1107   LABSPEC 1.023 12/12/2019 1037   LABSPEC 1.025 10/06/2012 1107   PHURINE 7.0 12/12/2019 1037   GLUCOSEU NEGATIVE 12/12/2019 1037   GLUCOSEU Negative 10/06/2012 1107   HGBUR NEGATIVE 12/12/2019 1037   BILIRUBINUR NEGATIVE 12/12/2019 1037   BILIRUBINUR neg 10/27/2019 1639   BILIRUBINUR Negative 10/06/2012 1107   KETONESUR NEGATIVE 12/12/2019 1037   PROTEINUR NEGATIVE 12/12/2019 1037   UROBILINOGEN 0.2 10/27/2019 1639   NITRITE NEGATIVE 12/12/2019 1037   LEUKOCYTESUR NEGATIVE 12/12/2019 1037   LEUKOCYTESUR 3+ 10/06/2012 1107    Radiological Exams on Admission: DG Chest 2 View  Result Date: 07/01/2022 CLINICAL DATA:  short of breath EXAM: CHEST - 2 VIEW COMPARISON:  Chest x-ray 09/10/2018 FINDINGS: The heart and mediastinal contours are unchanged. Prominence of the hilar vasculature. Atherosclerotic  plaque. No focal consolidation. Chronic coarsened interstitial markings with increased interstitial markings. No pleural effusion. No pneumothorax. No acute osseous abnormality. IMPRESSION: 1. Pulmonary edema versus bronchitic changes. 2. Aortic Atherosclerosis (ICD10-I70.0) and Emphysema (ICD10-J43.9). Electronically Signed   By: Tish Frederickson M.D.   On: 07/01/2022 01:13     Data Reviewed: Relevant notes from primary care and specialist visits, past discharge summaries as available in EHR, including Care Everywhere. Prior diagnostic testing as pertinent to current admission diagnoses Updated medications and problem lists for reconciliation ED course, including vitals, labs, imaging, treatment and response to treatment Triage notes, nursing and pharmacy notes and ED provider's notes Notable results as noted in HPI   Assessment and Plan: * COPD with acute exacerbation (HCC) Acute respiratory failure with hypoxia Patient with increased work of breathing, tachypneic speaking in short sentences, O2 sat dropping to 88% with minimal exertion  and with conversation requiring 2 L to maintain sats in the mid to high 90s Schedule and as needed nebulized bronchodilator treatments, IV steroids, antitussives Rocephin and azithromycin  Follow blood and sputum cultures Supplemental oxygen and wean as tolerated  Essential hypertension Continue amlodipine        DVT prophylaxis: Lovenox  Consults: none  Advance Care Planning:   Code Status: Full Code   Family Communication: none  Disposition Plan: Back to previous home environment  Severity of Illness: The appropriate patient status for this patient is INPATIENT. Inpatient status is judged to be reasonable and necessary in order to provide the required intensity of service to ensure the patient's safety. The patient's presenting symptoms, physical exam findings, and initial radiographic and laboratory data in the context of their chronic  comorbidities is felt to place them at high risk for further clinical deterioration. Furthermore, it is not anticipated that the patient will be medically stable for discharge from the hospital within 2 midnights of admission.   * I certify that at the point of admission it is my clinical judgment that the patient will require inpatient hospital care spanning beyond 2 midnights from the point of admission due to high intensity of service, high risk for further deterioration and high frequency of surveillance required.*  Author: Athena Masse, MD 07/01/2022 3:13 AM  For on call review [email or web address].

## 2022-07-01 NOTE — Assessment & Plan Note (Addendum)
Acute respiratory failure with hypoxia Patient with increased work of breathing, tachypneic speaking in short sentences, O2 sat dropping to 88% with minimal exertion and with conversation requiring 2 L to maintain sats in the mid to high 90s Schedule and as needed nebulized bronchodilator treatments, IV steroids, antitussives Rocephin and azithromycin  Follow blood and sputum cultures Supplemental oxygen and wean as tolerated

## 2022-07-02 DIAGNOSIS — D72829 Elevated white blood cell count, unspecified: Secondary | ICD-10-CM

## 2022-07-02 DIAGNOSIS — J441 Chronic obstructive pulmonary disease with (acute) exacerbation: Secondary | ICD-10-CM | POA: Diagnosis not present

## 2022-07-02 DIAGNOSIS — I10 Essential (primary) hypertension: Secondary | ICD-10-CM

## 2022-07-02 DIAGNOSIS — Z720 Tobacco use: Secondary | ICD-10-CM | POA: Diagnosis not present

## 2022-07-02 LAB — BASIC METABOLIC PANEL
Anion gap: 9 (ref 5–15)
BUN: 12 mg/dL (ref 8–23)
CO2: 27 mmol/L (ref 22–32)
Calcium: 8.8 mg/dL — ABNORMAL LOW (ref 8.9–10.3)
Chloride: 102 mmol/L (ref 98–111)
Creatinine, Ser: 0.69 mg/dL (ref 0.44–1.00)
GFR, Estimated: >60 mL/min (ref >60)
Glucose, Bld: 192 mg/dL — ABNORMAL HIGH (ref 70–99)
Potassium: 4 mmol/L (ref 3.5–5.1)
Sodium: 138 mmol/L (ref 135–145)

## 2022-07-02 LAB — CBC
HCT: 35.3 % — ABNORMAL LOW (ref 36.0–46.0)
Hemoglobin: 11.7 g/dL — ABNORMAL LOW (ref 12.0–15.0)
MCH: 31.9 pg (ref 26.0–34.0)
MCHC: 33.1 g/dL (ref 30.0–36.0)
MCV: 96.2 fL (ref 80.0–100.0)
Platelets: 258 10*3/uL (ref 150–400)
RBC: 3.67 MIL/uL — ABNORMAL LOW (ref 3.87–5.11)
RDW: 12.4 % (ref 11.5–15.5)
WBC: 15.3 10*3/uL — ABNORMAL HIGH (ref 4.0–10.5)
nRBC: 0 % (ref 0.0–0.2)

## 2022-07-02 LAB — MAGNESIUM: Magnesium: 1.9 mg/dL (ref 1.7–2.4)

## 2022-07-02 MED ORDER — IPRATROPIUM-ALBUTEROL 0.5-2.5 (3) MG/3ML IN SOLN
3.0000 mL | Freq: Four times a day (QID) | RESPIRATORY_TRACT | Status: DC
  Administered 2022-07-02 – 2022-07-03 (×2): 3 mL via RESPIRATORY_TRACT
  Filled 2022-07-02 (×3): qty 3

## 2022-07-02 MED ORDER — MOMETASONE FURO-FORMOTEROL FUM 200-5 MCG/ACT IN AERO
2.0000 | INHALATION_SPRAY | Freq: Two times a day (BID) | RESPIRATORY_TRACT | Status: DC
  Administered 2022-07-02 – 2022-07-03 (×2): 2 via RESPIRATORY_TRACT
  Filled 2022-07-02 (×2): qty 8.8

## 2022-07-02 MED ORDER — IPRATROPIUM-ALBUTEROL 0.5-2.5 (3) MG/3ML IN SOLN: 3.0000 mL | Freq: Four times a day (QID) | RESPIRATORY_TRACT | Status: DC | PRN

## 2022-07-02 NOTE — Plan of Care (Signed)
  Problem: Clinical Measurements: Goal: Ability to maintain clinical measurements within normal limits will improve Outcome: Progressing   Problem: Activity: Goal: Risk for activity intolerance will decrease Outcome: Progressing   Problem: Nutrition: Goal: Adequate nutrition will be maintained Outcome: Progressing   Problem: Coping: Goal: Level of anxiety will decrease Outcome: Progressing   

## 2022-07-02 NOTE — Assessment & Plan Note (Signed)
Smoking cessation counseling 

## 2022-07-02 NOTE — Assessment & Plan Note (Signed)
Reactive leukocytosis No clinical sings of bacterial infections.

## 2022-07-02 NOTE — TOC Initial Note (Signed)
Transition of Care [facility]) - Initial/Assessment Note    Patient Details  Name: XIAMARA HULET MRN: [id number] Date of Birth: Nov 04, 1950  Transition of Care [facility]) CM/SW Contact:    Beverly Sessions, RN Phone Number: 07/02/2022, 3:25 PM  Clinical Narrative:                   Transition of Care [facility]) Screening Note   Patient Details  Name: KAMARIAH FRUCHTER Date of Birth: 1951/09/07   Transition of Care [facility]) CM/SW Contact:    Beverly Sessions, RN Phone Number: 07/02/2022, 3:26 PM    Transition of Care Department [facility]) has reviewed patient and no TOC needs have been identified at this time. We will continue to monitor patient advancement through interdisciplinary progression rounds. If new patient transition needs arise, please place a TOC consult.    Patient has been weaned to RA with exertion     Patient Goals and CMS Choice        Expected Discharge Plan and Services                                                Prior Living Arrangements/Services                       Activities of Daily Living Home Assistive Devices/Equipment: Dentures (specify type) ADL Screening (condition at time of admission) Patient's cognitive ability adequate to safely complete daily activities?: Yes Is the patient deaf or have difficulty hearing?: No Does the patient have difficulty seeing, even when wearing glasses/contacts?: No Does the patient have difficulty concentrating, remembering, or making decisions?: No Patient able to express need for assistance with ADLs?: Yes Does the patient have difficulty dressing or bathing?: No Independently performs ADLs?: Yes (appropriate for developmental age) Does the patient have difficulty walking or climbing stairs?: No Weakness of Legs: None Weakness of Arms/Hands: None  Permission Sought/Granted                  Emotional Assessment              Admission diagnosis:  COPD exacerbation ([place]) [J44.1] COPD  with acute exacerbation ([place]) [J44.1] Patient Active Problem List   Diagnosis Date Noted   COPD with acute exacerbation ([place]) 07/01/2022   Acute respiratory failure with hypoxia ([place]) 07/01/2022   Essential hypertension 07/01/2022   Vaginal pessary in situ 06/22/2020   Vaginal atrophy 12/19/2018   Third degree uterine prolapse 12/19/2018   PCP:  Freddy Finner, NP Pharmacy:   [facility] [address]), [place] [address] [place]) [place] [address] Phone: [phone] Fax: [place], [address] [place] [address] Phone: [phone] Fax: [phone]     Social Determinants of Health (SDOH) Interventions    Readmission Risk Interventions     No data to display

## 2022-07-02 NOTE — Progress Notes (Signed)
  Progress Note   Patient: Kimberly Berger [id number]:[id number] DOB: 09-18-1951 DOA: 07/01/2022     1 DOS: the patient was seen and examined on 07/02/2022   Brief hospital course: Kimberly Berger was admitted to the hospital with the working diagnosis of COPD exacerbation.   71 yo female with the past medical history of hypertension and COPD who presented with 2 days of worsening dyspnea, cough, increased sputum production and wheezing. On her initial physical examination her HR was 124, RR 22 to 28, 02 saturation 88% on room air, blood pressure 150/70, lungs with wheezing and rhonchi, heart with S1 and S2 present and tachycardic, abdomen with no distention, no lower extremity edema  Na 139, K 3,6 CL 103 bicarbonate 29, glucose 125, bun 14 cr 0,78  Wbc 14,7 hgb 13 plt 244   Chest radiograph with hyperinflation with no infiltrates.   EKG 121 bpm, normal axis, normal intervals, sinus rhythm with no significant ST segment or T wave changes.    Assessment and Plan: * COPD with acute exacerbation ([place]) Acute respiratory failure with hypoxia  Patient with improvement in her symptoms 02 saturation today is 96% on room air  Plan to continue with bronchodilatory therapy  Azithromycin and systemic corticosteroids Will add inhaled steroids Continue smoking cessation. If continue to improve possible dc home tomorrow   Essential hypertension Continue amlodipine for blood pressure control.   Leukocytosis Reactive leukocytosis No clinical sings of bacterial infections.  Tobacco abuse Smoking cessation counseling.         Subjective: Patient with improvement in her dyspnea but not back to baseline, no chest pain   Physical Exam: Vitals:   07/02/22 0740 07/02/22 0755 07/02/22 1204 07/02/22 1747  BP:  (!) 152/73 133/67 (!) 131/56  Pulse:  87 87 76  Resp:  20 18 16   Temp:  98.6 F (37 C) 98.3 F (36.8 C) 98.1 F (36.7 C)  TempSrc:  Oral Oral   SpO2: 98% 100% 96% 98%  Weight:      Height:        Neurology awake and alert ENT with no pallor Cardiovascular with S1 and S2 present and rhythmic Respiratory with mild end expiratory wheezing with no rhonchi or rales, positive prolonged expiratory phase Abdomen with no distention No lower extremity edema  Data Reviewed:    Family Communication: no family at the bedside   Disposition: Status is: Inpatient Remains inpatient appropriate because: improving exacerbation, possible dc home tomorrow   Planned Discharge Destination: Home   Author: Tawni Millers, MD 07/02/2022 5:50 PM  For on call review [email or web address].

## 2022-07-02 NOTE — Progress Notes (Signed)
Mobility Specialist - Progress Note   07/02/22 1000  Mobility  Activity Ambulated independently in hallway  Level of Assistance Independent  Assistive Device None  Distance Ambulated (ft) 180 ft  Activity Response Tolerated well  $Mobility charge 1 Mobility     O2 while resting on RA = 95%  O2 while AMB on RA = 99%    O2 while AMB on ?L = N/A   Pt ambulating from bathroom upon arrival, utilizing RA. Pt continued ambulation into hallway independently. No SOB, but does voice mild dizziness d/t hx of vertigo per pt. HR peaked at 151 bpm during ambulation but returned to mid 90s once resting. Pt left in bed with needs in reach. RN notified.    Kathee Delton Mobility Specialist 07/02/22, 10:56 AM

## 2022-07-02 NOTE — Hospital Course (Signed)
Kimberly Berger was admitted to the hospital with the working diagnosis of COPD exacerbation.   71 yo female with the past medical history of hypertension and COPD who presented with 2 days of worsening dyspnea, cough, increased sputum production and wheezing. On her initial physical examination her HR was 124, RR 22 to 28, 02 saturation 88% on room air, blood pressure 150/70, lungs with wheezing and rhonchi, heart with S1 and S2 present and tachycardic, abdomen with no distention, no lower extremity edema  Na 139, K 3,6 CL 103 bicarbonate 29, glucose 125, bun 14 cr 0,78  Wbc 14,7 hgb 13 plt 244   Chest radiograph with hyperinflation with no infiltrates.   EKG 121 bpm, normal axis, normal intervals, sinus rhythm with no significant ST segment or T wave changes.

## 2022-07-03 LAB — CBC WITH DIFFERENTIAL/PLATELET
Abs Immature Granulocytes: 0.15 10*3/uL — ABNORMAL HIGH (ref 0.00–0.07)
Basophils Absolute: 0 10*3/uL (ref 0.0–0.1)
Basophils Relative: 0 %
Eosinophils Absolute: 0 10*3/uL (ref 0.0–0.5)
Eosinophils Relative: 0 %
HCT: 35.4 % — ABNORMAL LOW (ref 36.0–46.0)
Hemoglobin: 11.7 g/dL — ABNORMAL LOW (ref 12.0–15.0)
Immature Granulocytes: 1 %
Lymphocytes Relative: 17 %
Lymphs Abs: 2.5 10*3/uL (ref 0.7–4.0)
MCH: 32 pg (ref 26.0–34.0)
MCHC: 33.1 g/dL (ref 30.0–36.0)
MCV: 96.7 fL (ref 80.0–100.0)
Monocytes Absolute: 0.9 10*3/uL (ref 0.1–1.0)
Monocytes Relative: 6 %
Neutro Abs: 11.6 10*3/uL — ABNORMAL HIGH (ref 1.7–7.7)
Neutrophils Relative %: 76 %
Platelets: 272 10*3/uL (ref 150–400)
RBC: 3.66 MIL/uL — ABNORMAL LOW (ref 3.87–5.11)
RDW: 12.3 % (ref 11.5–15.5)
WBC: 15.1 10*3/uL — ABNORMAL HIGH (ref 4.0–10.5)
nRBC: 0 % (ref 0.0–0.2)

## 2022-07-03 LAB — BASIC METABOLIC PANEL
Anion gap: 7 (ref 5–15)
BUN: 13 mg/dL (ref 8–23)
CO2: 30 mmol/L (ref 22–32)
Calcium: 8.8 mg/dL — ABNORMAL LOW (ref 8.9–10.3)
Chloride: 104 mmol/L (ref 98–111)
Creatinine, Ser: 0.6 mg/dL (ref 0.44–1.00)
GFR, Estimated: >60 mL/min (ref >60)
Glucose, Bld: 139 mg/dL — ABNORMAL HIGH (ref 70–99)
Potassium: 3.9 mmol/L (ref 3.5–5.1)
Sodium: 141 mmol/L (ref 135–145)

## 2022-07-03 LAB — CULTURE, RESPIRATORY W GRAM STAIN

## 2022-07-03 MED ORDER — PREDNISONE 10 MG PO TABS: ORAL_TABLET | ORAL | 0 refills | Status: AC

## 2022-07-03 MED ORDER — CEFADROXIL 500 MG PO CAPS: 500.0000 mg | ORAL_CAPSULE | Freq: Two times a day (BID) | ORAL | 0 refills | Status: AC

## 2022-07-03 NOTE — Discharge Summary (Signed)
Physician Discharge Summary   Patient: Kimberly Berger MRN: [id number] DOB: January 09, 1951  Admit date:     07/01/2022  Discharge date: 07/05/22  Discharge Physician: Ezekiel Slocumb   PCP: Kimberly Finner, NP   Recommendations at discharge:    Follow up with Primary Care in 1-2 weeks Repeat CBC, BMP in 1-2 weeks  Discharge Diagnoses: Principal Problem:   COPD with acute exacerbation ([place]) Active Problems:   Essential hypertension   Leukocytosis   Tobacco abuse  Resolved Problems:   * No resolved hospital problems. [facility] Course: [name] was admitted to the hospital with the working diagnosis of COPD exacerbation.   71 yo female with the past medical history of hypertension and COPD who presented with 2 days of worsening dyspnea, cough, increased sputum production and wheezing. On her initial physical examination her HR was 124, RR 22 to 28, 02 saturation 88% on room air, blood pressure 150/70, lungs with wheezing and rhonchi, heart with S1 and S2 present and tachycardic, abdomen with no distention, no lower extremity edema  Na 139, K 3,6 CL 103 bicarbonate 29, glucose 125, bun 14 cr 0,78  Wbc 14,7 hgb 13 plt 244   Chest radiograph with hyperinflation with no infiltrates.   EKG 121 bpm, normal axis, normal intervals, sinus rhythm with no significant ST segment or T wave changes.   9/19 --- pt has clinically improved and is medically stable to discharge home today.    Assessment and Plan: * COPD with acute exacerbation ([place]) Acute respiratory failure with hypoxia Patient with improvement in her symptoms. O2 sats stable on room air.  Treated with bronchodilatory therapy, Azithromycin and systemic corticosteroids. Added inhaled steroids. Continue smoking cessation strongly encouraged. Prednisone taper at discharge   Essential hypertension Continue amlodipine for blood pressure control.   Leukocytosis Reactive leukocytosis No clinical sings of bacterial  infections.  Tobacco abuse Smoking cessation counseling.          Consultants: none Procedures performed: none  Disposition: Home Diet recommendation:  Discharge Diet Orders (From admission, onward)     Start     Ordered   07/03/22 0000  Diet - low sodium heart healthy        07/03/22 1437           Cardiac diet DISCHARGE MEDICATION: Allergies as of 07/03/2022   No Known Allergies      Medication List     TAKE these medications    albuterol 108 (90 Base) MCG/ACT inhaler Commonly known as: VENTOLIN HFA Inhale 2 puffs into the lungs every 4 (four) hours as needed for wheezing or shortness of breath.   amLODipine 5 MG tablet Commonly known as: NORVASC Take 5 mg by mouth daily.   aspirin EC 325 MG tablet Take 325 mg by mouth daily.   cefadroxil 500 MG capsule Commonly known as: DURICEF Take 1 capsule (500 mg total) by mouth 2 (two) times daily for 5 days.   ibuprofen 600 MG tablet Commonly known as: ADVIL Take 1 tablet (600 mg total) by mouth every 8 (eight) hours as needed for moderate pain (with food).   meclizine 25 MG tablet Commonly known as: ANTIVERT Take 1 tablet (25 mg total) by mouth 3 (three) times daily as needed for dizziness.   mometasone-formoterol 100-5 MCG/ACT Aero Commonly known as: DULERA Inhale 2 puffs into the lungs 2 (two) times daily.   predniSONE 10 MG tablet Commonly known as: DELTASONE Take 4 tablets (40 mg total) by mouth  daily for 1 day, THEN 3 tablets (30 mg total) daily for 1 day, THEN 2 tablets (20 mg total) daily for 1 day, THEN 1 tablet (10 mg total) daily for 1 day. Start taking on: July 03, 2022   tiotropium 18 MCG inhalation capsule Commonly known as: SPIRIVA Place 18 mcg into inhaler and inhale daily.        Discharge Exam: Filed Weights   07/01/22 0000 07/01/22 0347  Weight: 54.9 kg 56.7 kg   General exam: awake, alert, no acute distress HEENT: atraumatic, clear conjunctiva, anicteric sclera,  moist mucus membranes, hearing grossly normal  Respiratory system: CTAB but diminished throughout, no wheezes, normal respiratory effort, on room air. Cardiovascular system: normal S1/S2, RRR, no pedal edema.   Central nervous system: A&O x3. no gross focal neurologic deficits, normal speech Extremities: moves all, no edema, normal tone Skin: dry, intact, normal temperatur Psychiatry: normal mood, congruent affect, judgement and insight appear normal   Condition at discharge: stable  The results of significant diagnostics from this hospitalization (including imaging, microbiology, ancillary and laboratory) are listed below for reference.   Imaging Studies: DG Chest 2 View  Result Date: 07/01/2022 CLINICAL DATA:  short of breath EXAM: CHEST - 2 VIEW COMPARISON:  Chest x-ray 09/10/2018 FINDINGS: The heart and mediastinal contours are unchanged. Prominence of the hilar vasculature. Atherosclerotic plaque. No focal consolidation. Chronic coarsened interstitial markings with increased interstitial markings. No pleural effusion. No pneumothorax. No acute osseous abnormality. IMPRESSION: 1. Pulmonary edema versus bronchitic changes. 2. Aortic Atherosclerosis (ICD10-I70.0) and Emphysema (ICD10-J43.9). Electronically Signed   By: Iven Finn M.D.   On: 07/01/2022 01:13    Microbiology: Results for orders placed or performed during the hospital encounter of 07/01/22  SARS Coronavirus 2 by RT PCR (hospital order, performed in [facility] hospital lab) *cepheid single result test* Anterior Nasal Swab     Status: None   Collection Time: 07/01/22  1:17 AM   Specimen: Anterior Nasal Swab  Result Value Ref Range Status   SARS Coronavirus 2 by RT PCR NEGATIVE NEGATIVE Final    Comment: (NOTE) SARS-CoV-2 target nucleic acids are NOT DETECTED.  The SARS-CoV-2 RNA is generally detectable in upper and lower respiratory specimens during the acute phase of infection. The lowest concentration of SARS-CoV-2  viral copies this assay can detect is 250 copies / mL. A negative result does not preclude SARS-CoV-2 infection and should not be used as the sole basis for treatment or other patient management decisions.  A negative result may occur with improper specimen collection / handling, submission of specimen other than nasopharyngeal swab, presence of viral mutation(s) within the areas targeted by this assay, and inadequate number of viral copies (<250 copies / mL). A negative result must be combined with clinical observations, patient history, and epidemiological information.  Fact Sheet for Patients:   https://www.patel.info/  Fact Sheet for Healthcare Providers: https://hall.com/  This test is not yet approved or  cleared by the [place] FDA and has been authorized for detection and/or diagnosis of SARS-CoV-2 by FDA under an Emergency Use Authorization (EUA).  This EUA will remain in effect (meaning this test can be used) for the duration of the COVID-19 declaration under Section 564(b)(1) of the Act, 21 U.S.C. section 360bbb-3(b)(1), unless the authorization is terminated or revoked sooner.  Performed at [facility], [address] [place], [place] [address]   Expectorated Sputum Assessment w Gram Stain, Rflx to Resp Cult     Status: None  Collection Time: 07/01/22  6:44 AM   Specimen: Expectorated Sputum  Result Value Ref Range Status   Specimen Description EXPECTORATED SPUTUM  Final   Special Requests NONE  Final   Sputum evaluation   Final    THIS SPECIMEN IS ACCEPTABLE FOR SPUTUM CULTURE Performed at [facility], [address] [place], [place] [address]    Report Status 07/01/2022 FINAL  Final  Culture, Respiratory w Gram Stain     Status: None   Collection Time: 07/01/22  6:44 AM  Result Value Ref Range Status   Specimen Description   Final    EXPECTORATED SPUTUM Performed at [facility],  [address] [place], [place] [address]    Special Requests   Final    NONE Reflexed from [id number] Performed at [facility], [address] [place], [place] [address]    Gram Stain   Final    MODERATE WBC PRESENT,BOTH PMN AND MONONUCLEAR MODERATE GRAM NEGATIVE RODS FEW GRAM POSITIVE COCCI IN PAIRS AND CHAINS FEW YEAST WITH PSEUDOHYPHAE    Culture   Final    MODERATE GROUP B STREP(S.AGALACTIAE)ISOLATED TESTING AGAINST S. AGALACTIAE NOT ROUTINELY PERFORMED DUE TO PREDICTABILITY OF AMP/PEN/VAN SUSCEPTIBILITY. MODERATE HAEMOPHILUS INFLUENZAE BETA LACTAMASE POSITIVE Performed at [facility], [place] [address], [place], [place] [address]    Report Status 07/03/2022 FINAL  Final  Culture, blood (Routine X 2) w Reflex to ID Panel     Status: None (Preliminary result)   Collection Time: 07/01/22 12:34 PM   Specimen: BLOOD LEFT HAND  Result Value Ref Range Status   Specimen Description BLOOD LEFT HAND  Final   Special Requests   Final    BOTTLES DRAWN AEROBIC AND ANAEROBIC Blood Culture adequate volume   Culture   Final    NO GROWTH 4 DAYS Performed at [facility], [address] [place], [place] [address]    Report Status PENDING  Incomplete  Culture, blood (Routine X 2) w Reflex to ID Panel     Status: None (Preliminary result)   Collection Time: 07/01/22 12:39 PM   Specimen: BLOOD LEFT HAND  Result Value Ref Range Status   Specimen Description BLOOD LEFT HAND  Final   Special Requests   Final    BOTTLES DRAWN AEROBIC AND ANAEROBIC Blood Culture adequate volume   Culture   Final    NO GROWTH 4 DAYS Performed at [facility], [address] [place], [place] [address]    Report Status PENDING  Incomplete    Labs: CBC: Recent Labs  Lab 07/01/22 0027 07/02/22 0549 07/03/22 0422  WBC 14.7* 15.3* 15.1*  NEUTROABS 12.4*  --  11.6*  HGB 13.0 11.7* 11.7*  HCT 39.5 35.3* 35.4*  MCV 97.3 96.2 96.7  PLT 244 258 175   Basic Metabolic  Panel: Recent Labs  Lab 07/01/22 0027 07/02/22 0549 07/03/22 0422  NA 139 138 141  K 3.6 4.0 3.9  CL 103 102 104  CO2 29 27 30   GLUCOSE 125* 192* 139*  BUN 14 12 13   CREATININE 0.78 0.69 0.60  CALCIUM 8.8* 8.8* 8.8*  MG  --  1.9  --    Liver Function Tests: Recent Labs  Lab 07/01/22 0027  AST 25  ALT 18  ALKPHOS 92  BILITOT 1.0  PROT 8.2*  ALBUMIN 3.9   CBG: No results for input(s): "GLUCAP" in the last 168 hours.  Discharge time spent: less than 30 minutes.  Signed: Floyce Stakes  Arbutus Ped, DO [facility] 07/05/2022

## 2022-07-03 NOTE — Progress Notes (Signed)
AVS given and explained to patient. 

## 2022-07-03 NOTE — Plan of Care (Signed)

## 2022-07-03 NOTE — Plan of Care (Signed)
  Problem: Education: Goal: Knowledge of General Education information will improve Description: Including pain rating scale, medication(s)/side effects and non-pharmacologic comfort measures 07/03/2022 1412 by Luiz Iron, RN Outcome: Progressing 07/03/2022 1142 by Luiz Iron, RN Outcome: Progressing   Problem: Health Behavior/Discharge Planning: Goal: Ability to manage health-related needs will improve 07/03/2022 1412 by Luiz Iron, RN Outcome: Progressing 07/03/2022 1142 by Luiz Iron, RN Outcome: Progressing   Problem: Clinical Measurements: Goal: Ability to maintain clinical measurements within normal limits will improve Outcome: Progressing Goal: Will remain free from infection 07/03/2022 1412 by Luiz Iron, RN Outcome: Progressing 07/03/2022 1142 by Luiz Iron, RN Outcome: Progressing Goal: Diagnostic test results will improve Outcome: Progressing Goal: Respiratory complications will improve Outcome: Progressing Goal: Cardiovascular complication will be avoided Outcome: Progressing   Problem: Activity: Goal: Risk for activity intolerance will decrease 07/03/2022 1412 by Luiz Iron, RN Outcome: Progressing 07/03/2022 1142 by Luiz Iron, RN Outcome: Progressing   Problem: Nutrition: Goal: Adequate nutrition will be maintained 07/03/2022 1412 by Luiz Iron, RN Outcome: Progressing 07/03/2022 1142 by Luiz Iron, RN Outcome: Progressing   Problem: Coping: Goal: Level of anxiety will decrease Outcome: Progressing   Problem: Elimination: Goal: Will not experience complications related to bowel motility Outcome: Progressing Goal: Will not experience complications related to urinary retention Outcome: Progressing   Problem: Pain Managment: Goal: General experience of comfort will improve Outcome: Progressing   Problem: Safety: Goal: Ability to remain free from injury will improve Outcome:  Progressing   Problem: Skin Integrity: Goal: Risk for impaired skin integrity will decrease Outcome: Progressing   Problem: Education: Goal: Knowledge of disease or condition will improve Outcome: Progressing Goal: Knowledge of the prescribed therapeutic regimen will improve Outcome: Progressing Goal: Individualized Educational Video(s) Outcome: Progressing   Problem: Activity: Goal: Ability to tolerate increased activity will improve Outcome: Progressing Goal: Will verbalize the importance of balancing activity with adequate rest periods Outcome: Progressing   Problem: Respiratory: Goal: Ability to maintain a clear airway will improve Outcome: Progressing Goal: Levels of oxygenation will improve Outcome: Progressing Goal: Ability to maintain adequate ventilation will improve Outcome: Progressing

## 2022-07-06 LAB — CULTURE, BLOOD (ROUTINE X 2)
Culture: NO GROWTH
Culture: NO GROWTH
Special Requests: ADEQUATE
Special Requests: ADEQUATE

## 2022-07-23 ENCOUNTER — Ambulatory Visit
Admission: RE | Admit: 2022-07-23 | Discharge: 2022-07-23 | Disposition: A | Discharge status: Home / Self Care | Payer: Medicare Other | Source: Ambulatory Visit | Attending: Primary Care | Admitting: Primary Care

## 2022-07-23 DIAGNOSIS — Z1231 Encounter for screening mammogram for malignant neoplasm of breast: Secondary | ICD-10-CM | POA: Diagnosis present

## 2022-09-12 ENCOUNTER — Telehealth: Payer: Self-pay | Admitting: Obstetrics and Gynecology

## 2022-09-12 NOTE — Telephone Encounter (Signed)
Pt called in about her pessary.  She took it to clean it.  When she tried to reinsert it, she had some bleeding.  It is no longer bleeding, but she has not put the pessary back in.  She is very worried.  Can I fit her into your schedule tomorrow at the end of the day-4:15?  Your next available is Dec. 6th at 9:45.

## 2022-09-12 NOTE — Telephone Encounter (Signed)
She says that it is no longer bleeding, but she has not replaced it.  She is afraid to put it back in, but she needs to have it back in.  I think she said that she is using toilet paper to push it back up.

## 2022-09-12 NOTE — Telephone Encounter (Signed)
Let's check on her tomorrow morning. If she is still having bleeding she can be seen then. Likely it will have resolved by the end of the day today and won't need the appointment tomorrow.

## 2022-11-21 NOTE — Progress Notes (Deleted)
    GYNECOLOGY PROGRESS NOTE  Subjective:    Patient ID: CONITA AMENTA, female    DOB: [birth year], 72 y.o.   MRN: [id number]  HPI  Patient is a 72 y.o. G21P0 female who presents for Pessary Maintenance. Grade 3 uterine prolapse.  She manages the pessary herself.  Continues q 6 month checks.    The following portions of the patient's history were reviewed and updated as appropriate: allergies, current medications, past family history, past medical history, past social history, past surgical history, and problem list.  Review of Systems Pertinent items noted in HPI and remainder of comprehensive ROS otherwise negative.   Objective:   There were no vitals taken for this visit. There is no height or weight on file to calculate BMI. General appearance: alert, cooperative, and no distress Abdomen: {abdominal exam:16834} Pelvic: {pelvic exam:16852::"cervix normal in appearance","external genitalia normal","no adnexal masses or tenderness","no cervical motion tenderness","rectovaginal septum normal","uterus normal size, shape, and consistency","vagina normal without discharge"} Extremities: {extremity exam:5109} Neurologic: {neuro exam:17854}   Assessment:   No diagnosis found.   Plan:   There are no diagnoses linked to this encounter.      Rubie Maid, MD [place]

## 2022-11-22 ENCOUNTER — Ambulatory Visit: Payer: Medicaid Other | Admitting: Obstetrics and Gynecology

## 2022-11-26 NOTE — Progress Notes (Signed)
    GYNECOLOGY PROGRESS NOTE  Subjective:    Patient ID: Kimberly Berger, female    DOB: 1951-02-11, 72 y.o.   MRN: [id number]  HPI  Patient is a 72 y.o. G25P0 female who presents for Pessary Check. Grade 3 uterine prolapse. She manages the pessary herself. She has no new concerns about her pessary today.  Does report an episode of light bleeding with removal and reinsertion of her pessary approximately 1 month ago. Has not had any further episodes.   The following portions of the patient's history were reviewed and updated as appropriate: allergies, current medications, past family history, past medical history, past social history, past surgical history, and problem list.  Review of Systems Pertinent items are noted in HPI.   Objective:   Blood pressure 128/62, pulse (!) 108, resp. rate 16, height 5\' 4"  (1.626 m), weight 128 lb 11.2 oz (58.4 kg). Body mass index is 22.09 kg/m. General appearance: alert, cooperative, and no distress Abdomen: soft, non-tender; bowel sounds normal; no masses,  no organomegaly Pelvic: External genitalia normal, no lesions. Size 2 ring pessary removed, cleaned,and reinserted.  Speculum exam notes mildly atrophic vagina, no lesions or lacerations.  Grade 3 uterine prolapse.     Assessment:   1. Pessary maintenance   2. Third degree uterine prolapse   3. Vaginal atrophy      Plan:   - Speculum examination revealed normal vaginal mucosa with no lesions or lacerations. The patient should return in 3 months for a pessary check.  Currently using Vaseline for pessary maintenance.  Declines use of estrogen for vaginal atrophy and pessary maintenance.    Hildred Laser, MD [facility]

## 2022-11-28 ENCOUNTER — Encounter: Payer: Self-pay | Admitting: Obstetrics and Gynecology

## 2022-11-28 ENCOUNTER — Ambulatory Visit (INDEPENDENT_AMBULATORY_CARE_PROVIDER_SITE_OTHER): Payer: 59 | Admitting: Obstetrics and Gynecology

## 2022-11-28 VITALS — BP 128/62 | HR 108 | Resp 16 | Ht 64.0 in | Wt 128.7 lb

## 2022-11-28 DIAGNOSIS — N813 Complete uterovaginal prolapse: Secondary | ICD-10-CM

## 2022-11-28 DIAGNOSIS — Z4689 Encounter for fitting and adjustment of other specified devices: Secondary | ICD-10-CM | POA: Diagnosis not present

## 2022-11-28 DIAGNOSIS — N952 Postmenopausal atrophic vaginitis: Secondary | ICD-10-CM

## 2023-01-17 ENCOUNTER — Other Ambulatory Visit: Payer: Self-pay | Admitting: Primary Care

## 2023-01-17 DIAGNOSIS — Z1231 Encounter for screening mammogram for malignant neoplasm of breast: Secondary | ICD-10-CM

## 2023-01-24 ENCOUNTER — Emergency Department: Payer: 59

## 2023-01-24 ENCOUNTER — Other Ambulatory Visit: Payer: Self-pay

## 2023-01-24 ENCOUNTER — Emergency Department
Admission: EM | Admit: 2023-01-24 | Discharge: 2023-01-24 | Disposition: A | Discharge status: Home / Self Care | Payer: 59 | Attending: Emergency Medicine | Admitting: Emergency Medicine

## 2023-01-24 DIAGNOSIS — J449 Chronic obstructive pulmonary disease, unspecified: Secondary | ICD-10-CM | POA: Insufficient documentation

## 2023-01-24 DIAGNOSIS — R059 Cough, unspecified: Secondary | ICD-10-CM | POA: Diagnosis present

## 2023-01-24 DIAGNOSIS — J189 Pneumonia, unspecified organism: Secondary | ICD-10-CM

## 2023-01-24 DIAGNOSIS — R000 Tachycardia, unspecified: Secondary | ICD-10-CM | POA: Diagnosis not present

## 2023-01-24 DIAGNOSIS — J181 Lobar pneumonia, unspecified organism: Secondary | ICD-10-CM | POA: Insufficient documentation

## 2023-01-24 DIAGNOSIS — Z20822 Contact with and (suspected) exposure to covid-19: Secondary | ICD-10-CM | POA: Diagnosis not present

## 2023-01-24 DIAGNOSIS — F17200 Nicotine dependence, unspecified, uncomplicated: Secondary | ICD-10-CM | POA: Diagnosis not present

## 2023-01-24 DIAGNOSIS — R439 Unspecified disturbances of smell and taste: Secondary | ICD-10-CM | POA: Insufficient documentation

## 2023-01-24 DIAGNOSIS — E860 Dehydration: Secondary | ICD-10-CM | POA: Insufficient documentation

## 2023-01-24 DIAGNOSIS — I10 Essential (primary) hypertension: Secondary | ICD-10-CM | POA: Diagnosis not present

## 2023-01-24 HISTORY — DX: Chronic obstructive pulmonary disease, unspecified: J44.9

## 2023-01-24 LAB — COMPREHENSIVE METABOLIC PANEL
ALT: 23 U/L (ref 0–44)
AST: 35 U/L (ref 15–41)
Albumin: 3.1 g/dL — ABNORMAL LOW (ref 3.5–5.0)
Alkaline Phosphatase: 62 U/L (ref 38–126)
Anion gap: 8 (ref 5–15)
BUN: 9 mg/dL (ref 8–23)
CO2: 29 mmol/L (ref 22–32)
Calcium: 8.2 mg/dL — ABNORMAL LOW (ref 8.9–10.3)
Chloride: 95 mmol/L — ABNORMAL LOW (ref 98–111)
Creatinine, Ser: 0.61 mg/dL (ref 0.44–1.00)
GFR, Estimated: >60 mL/min (ref >60)
Glucose, Bld: 93 mg/dL (ref 70–99)
Potassium: 3 mmol/L — ABNORMAL LOW (ref 3.5–5.1)
Sodium: 132 mmol/L — ABNORMAL LOW (ref 135–145)
Total Bilirubin: 0.9 mg/dL (ref 0.3–1.2)
Total Protein: 6.9 g/dL (ref 6.5–8.1)

## 2023-01-24 LAB — RESP PANEL BY RT-PCR (RSV, FLU A&B, COVID)  RVPGX2
Influenza A by PCR: NEGATIVE
Influenza B by PCR: NEGATIVE
Resp Syncytial Virus by PCR: NEGATIVE
SARS Coronavirus 2 by RT PCR: NEGATIVE

## 2023-01-24 LAB — CBC WITH DIFFERENTIAL/PLATELET
Abs Immature Granulocytes: 0.05 10*3/uL (ref 0.00–0.07)
Basophils Absolute: 0 10*3/uL (ref 0.0–0.1)
Basophils Relative: 0 %
Eosinophils Absolute: 0 10*3/uL (ref 0.0–0.5)
Eosinophils Relative: 0 %
HCT: 32.7 % — ABNORMAL LOW (ref 36.0–46.0)
Hemoglobin: 11.2 g/dL — ABNORMAL LOW (ref 12.0–15.0)
Immature Granulocytes: 1 %
Lymphocytes Relative: 22 %
Lymphs Abs: 1.8 10*3/uL (ref 0.7–4.0)
MCH: 32 pg (ref 26.0–34.0)
MCHC: 34.3 g/dL (ref 30.0–36.0)
MCV: 93.4 fL (ref 80.0–100.0)
Monocytes Absolute: 0.5 10*3/uL (ref 0.1–1.0)
Monocytes Relative: 6 %
Neutro Abs: 5.6 10*3/uL (ref 1.7–7.7)
Neutrophils Relative %: 71 %
Platelets: 233 10*3/uL (ref 150–400)
RBC: 3.5 MIL/uL — ABNORMAL LOW (ref 3.87–5.11)
RDW: 12.6 % (ref 11.5–15.5)
Smear Review: NORMAL
WBC: 7.9 10*3/uL (ref 4.0–10.5)
nRBC: 0 % (ref 0.0–0.2)

## 2023-01-24 LAB — TROPONIN I (HIGH SENSITIVITY): Troponin I (High Sensitivity): 6 ng/L (ref <18)

## 2023-01-24 LAB — LACTIC ACID, PLASMA: Lactic Acid, Venous: 0.8 mmol/L (ref 0.5–1.9)

## 2023-01-24 MED ORDER — SODIUM CHLORIDE 0.9 % IV SOLN
1.0000 g | Freq: Once | INTRAVENOUS | Status: AC
  Administered 2023-01-24: 1 g via INTRAVENOUS
  Filled 2023-01-24: qty 10

## 2023-01-24 MED ORDER — IPRATROPIUM-ALBUTEROL 0.5-2.5 (3) MG/3ML IN SOLN
3.0000 mL | Freq: Once | RESPIRATORY_TRACT | Status: AC
  Administered 2023-01-24: 3 mL via RESPIRATORY_TRACT
  Filled 2023-01-24: qty 3

## 2023-01-24 MED ORDER — SODIUM CHLORIDE 0.9 % IV BOLUS
1000.0000 mL | Freq: Once | INTRAVENOUS | Status: AC
  Administered 2023-01-24: 1000 mL via INTRAVENOUS

## 2023-01-24 MED ORDER — LEVOFLOXACIN 750 MG PO TABS: 750.0000 mg | ORAL_TABLET | Freq: Every day | ORAL | 0 refills | Status: AC

## 2023-01-24 NOTE — ED Provider Notes (Signed)
ED ECG REPORT I, Kimberly Berger, the attending physician, personally viewed and interpreted this ECG.  Date: 01/24/2023 EKG Time: 1433 Rate: 92 Rhythm: normal sinus rhythm QRS Axis: normal Intervals: normal ST/T Wave abnormalities: normal Narrative Interpretation: no evidence of acute ischemia    Kimberly Bucy, MD 01/24/23 1436

## 2023-01-24 NOTE — ED Provider Notes (Signed)
[facility] Provider Note    Event Date/Time   First MD Initiated Contact with Patient 01/24/23 1301     (approximate)   History   Cough   HPI  Kimberly Berger is a 72 y.o. female with history of COPD and hypertension presents emergency department with cough, diarrhea, loss of taste since Sunday.  Patient states she feels dehydrated.  Has some shortness of breath especially with activity.  Diarrhea has decreased to once a day.  Patient continues to smoke.      Physical Exam   Triage Vital Signs: ED Triage Vitals  Enc Vitals Group     BP 01/24/23 1247 114/70     Pulse Rate 01/24/23 1247 (!) 104     Resp 01/24/23 1247 16     Temp 01/24/23 1247 98.9 F (37.2 C)     Temp Source 01/24/23 1247 Oral     SpO2 01/24/23 1247 92 %     Weight 01/24/23 1248 128 lb (58.1 kg)     Height 01/24/23 1248 5\' 4"  (1.626 m)     Head Circumference --      Peak Flow --      Pain Score 01/24/23 1257 0     Pain Loc --      Pain Edu? --      Excl. in GC? --     Most recent vital signs: Vitals:   01/24/23 1247  BP: 114/70  Pulse: (!) 104  Resp: 16  Temp: 98.9 F (37.2 C)  SpO2: 92%     General: Awake, no distress.   CV:  Good peripheral perfusion.  Tachycardic, regular rhythm  resp:  Normal effort. Lungs with wheezing bilaterally Abd:  No distention.   Other:      ED Results / Procedures / Treatments   Labs (all labs ordered are listed, but only abnormal results are displayed) Labs Reviewed  COMPREHENSIVE METABOLIC PANEL - Abnormal; Notable for the following components:      Result Value   Sodium 132 (*)    Potassium 3.0 (*)    Chloride 95 (*)    Calcium 8.2 (*)    Albumin 3.1 (*)    All other components within normal limits  CBC WITH DIFFERENTIAL/PLATELET - Abnormal; Notable for the following components:   RBC 3.50 (*)    Hemoglobin 11.2 (*)    HCT 32.7 (*)    All other components within normal limits  RESP PANEL BY RT-PCR (RSV, FLU A&B,  COVID)  RVPGX2  LACTIC ACID, PLASMA  TROPONIN I (HIGH SENSITIVITY)     EKG  EKG   RADIOLOGY Chest x-ray    PROCEDURES:   Procedures   MEDICATIONS ORDERED IN ED: Medications  sodium chloride 0.9 % bolus 1,000 mL (1,000 mLs Intravenous New Bag/Given 01/24/23 1355)  ipratropium-albuterol (DUONEB) 0.5-2.5 (3) MG/3ML nebulizer solution 3 mL (3 mLs Nebulization Given 01/24/23 1355)  cefTRIAXone (ROCEPHIN) 1 g in sodium chloride 0.9 % 100 mL IVPB (1 g Intravenous New Bag/Given 01/24/23 1431)     IMPRESSION / MDM / ASSESSMENT AND PLAN / ED COURSE  I reviewed the triage vital signs and the nursing notes.                              Differential diagnosis includes, but is not limited to, respiratory distress, COVID, CAP, dehydration  Patient's presentation is most consistent with acute presentation with potential threat to  life or bodily function.   EKG, labs, chest x-ray and DuoNeb ordered  Patient's O2 saturation was 92% on room air in triage.  Patient did have some shortness of breath with exertion to the room.  Due to the amount of wheezing we will give a DuoNeb to see if this helps with her O2 saturation.  Chest x-ray will be obtained to rule out pneumonia/CHF.  Labs to assess for dehydration, patient was given 1 L normal saline IV for dehydration  O2 saturation increased to 99 to 100% on room air after the DuoNeb.  Patient states she is feeling better.  EKG showed normal sinus rhythm, no STEMI, I interpret this as being normal, see physician read  Chest x-ray was independently reviewed and interpreted by me as having a left lower lobe pneumonia, confirmed by radiology  Patient's labs are reassuring, and review of her past medical charts her labs are all at baseline for the patient.  I did consider admission for the patient due to the pneumonia and history of COPD.  However she is feeling much better after fluids and the nebulizer treatment.  She was given Rocephin 1 g IV  while here in the ED.  Will discharge her with prescription for Levaquin.  Patient was given strict instructions to return to the emergency department if worsening.  She is in agreement treatment plan.  And discharged in stable condition.      FINAL CLINICAL IMPRESSION(S) / ED DIAGNOSES   Final diagnoses:  Community acquired pneumonia of left lower lobe of lung  Dehydration     Rx / DC Orders   ED Discharge Orders          Ordered    levofloxacin (LEVAQUIN) 750 MG tablet  Daily        01/24/23 1552    For home use only DME Nebulizer machine        01/24/23 1553             Note:  This document was prepared using Dragon voice recognition software and may include unintentional dictation errors.    Faythe Ghee, PA-C 01/24/23 1559    Dionne Bucy, MD 01/24/23 [phone]

## 2023-01-24 NOTE — ED Triage Notes (Signed)
Pt to ED with cough, thinks might be dehydrated, loss of taste, diarrhea since Sunday. Has been having intermittent SOB with activity/ambulation and has hx COPD. Pt has unlabored respirations, skin warm and dry. When diarrhea stated it was 3-4 times/day but now only once daily. [place] got [place]. Current cigarette smoker ("1-2/day") since 50 years.  Is well appearing. Pt swabbed in triage.

## 2023-01-24 NOTE — ED Triage Notes (Signed)
First Nurse Note:  C/O cough, loss of taste, chills since Sunday.  AAOx3.  Skin warm and dry.  No SOB/ DOE  NAD

## 2023-01-24 NOTE — ED Notes (Signed)
Walked with pt back to room from triage and pt became SOB while walking, had to stop to catch breath. Placed on pulse ox, not reading well on finger (nail polish), placed on ear and readings are 95-100% on RA.

## 2023-03-02 ENCOUNTER — Emergency Department: Payer: 59

## 2023-03-02 ENCOUNTER — Emergency Department
Admission: EM | Admit: 2023-03-02 | Discharge: 2023-03-02 | Disposition: A | Discharge status: Home / Self Care | Payer: 59 | Attending: Emergency Medicine | Admitting: Emergency Medicine

## 2023-03-02 ENCOUNTER — Other Ambulatory Visit: Payer: Self-pay

## 2023-03-02 DIAGNOSIS — M79671 Pain in right foot: Secondary | ICD-10-CM | POA: Diagnosis not present

## 2023-03-02 DIAGNOSIS — W010XXA Fall on same level from slipping, tripping and stumbling without subsequent striking against object, initial encounter: Secondary | ICD-10-CM | POA: Insufficient documentation

## 2023-03-02 DIAGNOSIS — S6992XA Unspecified injury of left wrist, hand and finger(s), initial encounter: Secondary | ICD-10-CM | POA: Diagnosis present

## 2023-03-02 DIAGNOSIS — W19XXXA Unspecified fall, initial encounter: Secondary | ICD-10-CM

## 2023-03-02 DIAGNOSIS — Y92524 Gas station as the place of occurrence of the external cause: Secondary | ICD-10-CM | POA: Insufficient documentation

## 2023-03-02 DIAGNOSIS — S63502A Unspecified sprain of left wrist, initial encounter: Secondary | ICD-10-CM | POA: Diagnosis not present

## 2023-03-02 DIAGNOSIS — Y9389 Activity, other specified: Secondary | ICD-10-CM | POA: Insufficient documentation

## 2023-03-02 MED ORDER — MELOXICAM 7.5 MG PO TABS: 7.5000 mg | ORAL_TABLET | Freq: Every day | ORAL | 0 refills | Status: DC

## 2023-03-02 NOTE — ED Provider Notes (Signed)
[facility] Provider Note  Patient Contact: 8:04 PM (approximate)   History   Fall   HPI  Kimberly Berger is a 72 y.o. female who presents the emergency department complaining of left wrist pain and right foot pain.  She states that she was at a gas station, playing on some of their games when the stool that she was sitting on which had wheels slid out from underneath her on the wet floor.  She fell and hurt her left wrist primarily.  Patient did not hit her head or lose consciousness.  Denies any back or hip pain.  Patient's primary complaint is left wrist injury.     Physical Exam   Triage Vital Signs: ED Triage Vitals  Enc Vitals Group     BP 03/02/23 1747 (!) 143/62     Pulse Rate 03/02/23 1747 90     Resp 03/02/23 1747 16     Temp 03/02/23 1747 98.4 F (36.9 C)     Temp Source 03/02/23 1747 Oral     SpO2 03/02/23 1750 94 %     Weight 03/02/23 1748 122 lb (55.3 kg)     Height 03/02/23 1748 5\' 4"  (1.626 m)     Head Circumference --      Peak Flow --      Pain Score 03/02/23 1748 8     Pain Loc --      Pain Edu? --      Excl. in GC? --     Most recent vital signs: Vitals:   03/02/23 1747 03/02/23 1750  BP: (!) 143/62   Pulse: 90   Resp: 16   Temp: 98.4 F (36.9 C)   SpO2:  94%     General: Alert and in no acute distress.  Cardiovascular:  Good peripheral perfusion Respiratory: Normal respiratory effort without tachypnea or retractions. Lungs CTAB. Musculoskeletal: Full range of motion to all extremities.  No obvious deformity, open wounds to the left wrist. Neurologic:  No gross focal neurologic deficits are appreciated.  Skin:   No rash noted Other:   ED Results / Procedures / Treatments   Labs (all labs ordered are listed, but only abnormal results are displayed) Labs Reviewed - No data to display   EKG     RADIOLOGY  I personally viewed, evaluated, and interpreted these images as part of my medical decision making,  as well as reviewing the written report by the radiologist.  ED Provider Interpretation: No acute traumatic finding on the left wrist  DG Wrist Complete Left  Result Date: 03/02/2023 CLINICAL DATA:  Fall onto left EXAM: LEFT WRIST - COMPLETE 4 VIEW COMPARISON:  None Available. FINDINGS: There is no evidence of fracture or dislocation. There is no evidence of arthropathy or other focal bone abnormality. Soft tissues are unremarkable. IMPRESSION: No acute fracture or dislocation. Electronically Signed   By: Agustin Cree M.D.   On: 03/02/2023 18:54    PROCEDURES:  Critical Care performed: No  Procedures   MEDICATIONS ORDERED IN ED: Medications - No data to display   IMPRESSION / MDM / ASSESSMENT AND PLAN / ED COURSE  I reviewed the triage vital signs and the nursing notes.                                 Differential diagnosis includes, but is not limited to, fall, left wrist pain, left wrist fracture, hand  fracture  Patient's presentation is most consistent with acute presentation with potential threat to life or bodily function.   Patient's diagnosis is consistent with fall, left wrist sprain.  Patient presents the emergency department after falling and landing on her wrist.  Patient had no obvious deformity, still using wrist appropriately.  Imaging is negative at this time.  Patient will have low-dose anti-inflammatory for symptom improvement.  Follow-up primary care as needed..  Patient is given ED precautions to return to the ED for any worsening or new symptoms.     FINAL CLINICAL IMPRESSION(S) / ED DIAGNOSES   Final diagnoses:  Fall, initial encounter  Sprain of left wrist, initial encounter     Rx / DC Orders   ED Discharge Orders          Ordered    meloxicam (MOBIC) 7.5 MG tablet  Daily        03/02/23 2005             Note:  This document was prepared using Dragon voice recognition software and may include unintentional dictation errors.   Lanette Hampshire 03/02/23 2008    Concha Se, MD 03/03/23 1121

## 2023-03-02 NOTE — ED Triage Notes (Addendum)
Pt to ED from store, where she tripped and fell due to the floor being wet. Pt states "there was no wet floor signs anywhere". Pt denies hitting her head on the ground. She caught herself and is having wrist pain in both arms, right foot pain and both shoulders hurt. Pt is CAOx4 and in no acute distress and ambulatory in triage. Pt has multiple pain location complaints but no obvious injuries noted at this time.

## 2023-03-02 NOTE — ED Notes (Signed)
Pt verbalizes understanding of discharge instructions. Opportunity for questioning and answers were provided. Pt discharged from ED to home with family.    

## 2023-06-28 ENCOUNTER — Ambulatory Visit: Payer: 59 | Admitting: Obstetrics and Gynecology

## 2023-07-17 ENCOUNTER — Encounter: Payer: Self-pay | Admitting: Obstetrics and Gynecology

## 2023-07-17 ENCOUNTER — Ambulatory Visit: Payer: 59 | Admitting: Obstetrics and Gynecology

## 2023-07-17 VITALS — BP 139/66 | HR 88 | Resp 16 | Ht 64.0 in | Wt 127.5 lb

## 2023-07-17 DIAGNOSIS — Z4689 Encounter for fitting and adjustment of other specified devices: Secondary | ICD-10-CM | POA: Diagnosis not present

## 2023-07-17 DIAGNOSIS — N813 Complete uterovaginal prolapse: Secondary | ICD-10-CM

## 2023-07-17 DIAGNOSIS — N952 Postmenopausal atrophic vaginitis: Secondary | ICD-10-CM

## 2023-07-17 NOTE — Progress Notes (Signed)
GYNECOLOGY PROGRESS NOTE  Subjective:    Patient ID: Kimberly Berger, female    DOB: Jan 03, 1951, 72 y.o.   MRN: [id number]  HPI  Patient is a 72 y.o. G14P0 female who presents for pessary check. Grade 3 uterine prolapse. She manages the pessary herself. Pain and light bleeding with removal and reinsertion of her pessary approximately 1 month ago. Has not had any further episodes.   The following portions of the patient's history were reviewed and updated as appropriate: allergies, current medications, past family history, past medical history, past social history, past surgical history, and problem list.  Review of Systems Pertinent items noted in HPI and remainder of comprehensive ROS otherwise negative.   Objective:   Blood pressure 139/66, pulse 88, resp. rate 16, height 5\' 4"  (1.626 m), weight 127 lb 8 oz (57.8 kg). Body mass index is 21.89 kg/m. General appearance: alert, cooperative, and no distress Abdomen: soft, non-tender; bowel sounds normal; no masses,  no organomegaly Pelvic: External genitalia normal, no lesions. Size 2 ring pessary removed, cleaned,and reinserted.  Speculum exam notes mildly atrophic vagina, no lesions or lacerations.  Grade 3 uterine prolapse.   Assessment:   1. Pessary maintenance   2. Third degree uterine prolapse   3. Vaginal atrophy      Plan:   - Speculum examination revealed mildly atrophic vaginal mucosa with no lesions or lacerations. Advised that likely atrophy or mild trauma with insertion was cause of pain with last removal and reinsertion. Currently using Vaseline for pessary maintenance.  Declines use of estrogen for vaginal atrophy and pessary maintenance.  - The patient should return in 6 months for a pessary check.      Hildred Laser, MD [facility]

## 2023-07-17 NOTE — Patient Instructions (Signed)
How to Use a Vaginal Pessary  A vaginal pessary is a removable device that is placed into your vagina to support pelvic organs that droop. These organs include your uterus, bladder, and rectum. When your pelvic organs drop down into your vagina, it causes a condition called pelvic organ prolapse (POP). A pessary may be an alternative to surgery for women with POP. It may help women who leak urine when they strain or exercise (stress incontinence). This is a symptom of POP. A vaginal pessary may also be a temporary treatment for stress incontinence during pregnancy. There are several types of pessaries. All types are usually made of silicone. You can insert and remove some on your own. Other types must be inserted and removed by your health care provider at office visits. The reason you are using a pessary and the severity of your condition will determine which one is best for you. It is also important to find the right size. A pessary that is too small may fall out. A pessary that is too large may cause pain or discomfort. Your health care provider will do a physical exam to find the correct size and fit for your pessary. It may take several appointments to find the best fit for you. If you can be fit with the type of pessary that you can insert, remove, and clean yourself, your health care provider will teach you how to use your pessary at home. You may have checkups every few months. If you have the type of pessary that needs to be inserted and removed by your health care provider, you will have appointments every few months to have the pessary removed, cleaned, and replaced. What are the risks? When properly fitted and cared for, risks of using a vaginal pessary can be small. However, there can be problems that may include: Vaginal discharge. Vaginal bleeding. A bad smell coming from your vagina. Scraping of the skin inside your vagina. How to use your pessary Follow your health care provider's  instructions for using a pessary. These instructions may vary, depending on the type of pessary you have. To insert a pessary: Wash your hands with soap and water for at least 20 seconds. Squeeze or fold the pessary in half and lubricate the tip with a water-based lubricant. Insert the pessary into your vagina. It will unfold and provide support. To remove the pessary, gently tug it out of your vagina. You can remove the pessary every night or after several days. You can also remove it to have sex. How to care for your pessary If you have a pessary that you can remove: Clean your pessary with soap and water. Rinse well. Dry it completely before inserting it back into your vagina. Follow these instructions at home: Take over-the-counter and prescription medicines only as told by your health care provider. Your health care provider may prescribe an estrogen cream to moisten your vagina. Keep all follow-up visits. This is important. Contact a health care provider if: You feel any pain or discomfort when your pessary is in place. You continue to have stress incontinence. You have trouble keeping your pessary from falling out. You have an unusual vaginal discharge that is blood-tinged or smells bad. Summary A vaginal pessary is a removable device that is placed into your vagina to support pelvic organs that droop. This condition is called pelvic organ prolapse (POP). There are several types of pessaries. Some you can insert and remove on your own. Others must be inserted and  removed by your health care provider. The best type for you depends on the reason you are using a pessary and the severity of your condition. It is also important to find the right size. If you can use the type that you insert and remove on your own, your health care provider will teach you how to use it and schedule checkups every few months. If you have the type that needs to be inserted and removed by your health care  provider, you will have regular appointments to have your pessary removed, cleaned, and replaced. This information is not intended to replace advice given to you by your health care provider. Make sure you discuss any questions you have with your health care provider. Document Revised: 03/31/2020 Document Reviewed: 03/31/2020 Elsevier Patient Education  2024 [employer].

## 2023-10-01 ENCOUNTER — Other Ambulatory Visit: Payer: Self-pay

## 2023-10-01 ENCOUNTER — Emergency Department: Payer: 59

## 2023-10-01 ENCOUNTER — Inpatient Hospital Stay
Admission: EM | Admit: 2023-10-01 | Discharge: 2023-10-04 | DRG: 871 | Disposition: A | Discharge status: Home / Self Care | Payer: 59 | Attending: Internal Medicine | Admitting: Internal Medicine

## 2023-10-01 DIAGNOSIS — Z791 Long term (current) use of non-steroidal anti-inflammatories (NSAID): Secondary | ICD-10-CM | POA: Diagnosis not present

## 2023-10-01 DIAGNOSIS — Z79899 Other long term (current) drug therapy: Secondary | ICD-10-CM | POA: Diagnosis not present

## 2023-10-01 DIAGNOSIS — F17210 Nicotine dependence, cigarettes, uncomplicated: Secondary | ICD-10-CM | POA: Diagnosis present

## 2023-10-01 DIAGNOSIS — Z8249 Family history of ischemic heart disease and other diseases of the circulatory system: Secondary | ICD-10-CM

## 2023-10-01 DIAGNOSIS — J9601 Acute respiratory failure with hypoxia: Secondary | ICD-10-CM | POA: Diagnosis present

## 2023-10-01 DIAGNOSIS — J189 Pneumonia, unspecified organism: Secondary | ICD-10-CM | POA: Diagnosis present

## 2023-10-01 DIAGNOSIS — Z7951 Long term (current) use of inhaled steroids: Secondary | ICD-10-CM | POA: Diagnosis not present

## 2023-10-01 DIAGNOSIS — Z720 Tobacco use: Secondary | ICD-10-CM | POA: Diagnosis present

## 2023-10-01 DIAGNOSIS — Z803 Family history of malignant neoplasm of breast: Secondary | ICD-10-CM | POA: Diagnosis not present

## 2023-10-01 DIAGNOSIS — J440 Chronic obstructive pulmonary disease with acute lower respiratory infection: Secondary | ICD-10-CM | POA: Diagnosis present

## 2023-10-01 DIAGNOSIS — Z7982 Long term (current) use of aspirin: Secondary | ICD-10-CM | POA: Diagnosis not present

## 2023-10-01 DIAGNOSIS — Z1152 Encounter for screening for COVID-19: Secondary | ICD-10-CM

## 2023-10-01 DIAGNOSIS — Z66 Do not resuscitate: Secondary | ICD-10-CM | POA: Diagnosis present

## 2023-10-01 DIAGNOSIS — J441 Chronic obstructive pulmonary disease with (acute) exacerbation: Secondary | ICD-10-CM | POA: Diagnosis present

## 2023-10-01 DIAGNOSIS — A419 Sepsis, unspecified organism: Principal | ICD-10-CM | POA: Diagnosis present

## 2023-10-01 DIAGNOSIS — I10 Essential (primary) hypertension: Secondary | ICD-10-CM | POA: Diagnosis present

## 2023-10-01 LAB — CBC WITH DIFFERENTIAL/PLATELET
Abs Immature Granulocytes: 0.09 10*3/uL — ABNORMAL HIGH (ref 0.00–0.07)
Basophils Absolute: 0 10*3/uL (ref 0.0–0.1)
Basophils Relative: 0 %
Eosinophils Absolute: 0 10*3/uL (ref 0.0–0.5)
Eosinophils Relative: 0 %
HCT: 37.3 % (ref 36.0–46.0)
Hemoglobin: 12.8 g/dL (ref 12.0–15.0)
Immature Granulocytes: 1 %
Lymphocytes Relative: 8 %
Lymphs Abs: 1.1 10*3/uL (ref 0.7–4.0)
MCH: 32.2 pg (ref 26.0–34.0)
MCHC: 34.3 g/dL (ref 30.0–36.0)
MCV: 93.7 fL (ref 80.0–100.0)
Monocytes Absolute: 0.6 10*3/uL (ref 0.1–1.0)
Monocytes Relative: 5 %
Neutro Abs: 12 10*3/uL — ABNORMAL HIGH (ref 1.7–7.7)
Neutrophils Relative %: 86 %
Platelets: 218 10*3/uL (ref 150–400)
RBC: 3.98 MIL/uL (ref 3.87–5.11)
RDW: 12.2 % (ref 11.5–15.5)
WBC: 13.8 10*3/uL — ABNORMAL HIGH (ref 4.0–10.5)
nRBC: 0 % (ref 0.0–0.2)

## 2023-10-01 LAB — BASIC METABOLIC PANEL
Anion gap: 12 (ref 5–15)
BUN: 11 mg/dL (ref 8–23)
CO2: 27 mmol/L (ref 22–32)
Calcium: 8.6 mg/dL — ABNORMAL LOW (ref 8.9–10.3)
Chloride: 96 mmol/L — ABNORMAL LOW (ref 98–111)
Creatinine, Ser: 0.69 mg/dL (ref 0.44–1.00)
GFR, Estimated: >60 mL/min (ref >60)
Glucose, Bld: 128 mg/dL — ABNORMAL HIGH (ref 70–99)
Potassium: 4 mmol/L (ref 3.5–5.1)
Sodium: 135 mmol/L (ref 135–145)

## 2023-10-01 LAB — BRAIN NATRIURETIC PEPTIDE: B Natriuretic Peptide: 66 pg/mL (ref 0.0–100.0)

## 2023-10-01 LAB — RESP PANEL BY RT-PCR (RSV, FLU A&B, COVID)  RVPGX2
Influenza A by PCR: NEGATIVE
Influenza B by PCR: NEGATIVE
Resp Syncytial Virus by PCR: NEGATIVE
SARS Coronavirus 2 by RT PCR: NEGATIVE

## 2023-10-01 LAB — LACTIC ACID, PLASMA: Lactic Acid, Venous: 1.8 mmol/L (ref 0.5–1.9)

## 2023-10-01 LAB — TROPONIN I (HIGH SENSITIVITY): Troponin I (High Sensitivity): 11 ng/L (ref <18)

## 2023-10-01 MED ORDER — AMLODIPINE BESYLATE 5 MG PO TABS
5.0000 mg | ORAL_TABLET | Freq: Every day | ORAL | Status: DC
  Administered 2023-10-04: 5 mg via ORAL
  Filled 2023-10-01 (×3): qty 1

## 2023-10-01 MED ORDER — ENOXAPARIN SODIUM 40 MG/0.4ML IJ SOSY
40.0000 mg | PREFILLED_SYRINGE | INTRAMUSCULAR | Status: DC
  Administered 2023-10-01 – 2023-10-03 (×2): 40 mg via SUBCUTANEOUS
  Filled 2023-10-01 (×3): qty 0.4

## 2023-10-01 MED ORDER — GUAIFENESIN ER 600 MG PO TB12
600.0000 mg | ORAL_TABLET | Freq: Two times a day (BID) | ORAL | Status: DC
  Administered 2023-10-01 – 2023-10-04 (×6): 600 mg via ORAL
  Filled 2023-10-01 (×6): qty 1

## 2023-10-01 MED ORDER — ASPIRIN 325 MG PO TBEC
325.0000 mg | DELAYED_RELEASE_TABLET | Freq: Every day | ORAL | Status: DC
  Administered 2023-10-02 – 2023-10-04 (×3): 325 mg via ORAL
  Filled 2023-10-01 (×3): qty 1

## 2023-10-01 MED ORDER — HYDROCOD POLI-CHLORPHE POLI ER 10-8 MG/5ML PO SUER
5.0000 mL | Freq: Two times a day (BID) | ORAL | Status: DC | PRN
  Administered 2023-10-02 – 2023-10-03 (×2): 5 mL via ORAL
  Filled 2023-10-01 (×2): qty 5

## 2023-10-01 MED ORDER — PREDNISONE 20 MG PO TABS
60.0000 mg | ORAL_TABLET | Freq: Once | ORAL | Status: AC
  Administered 2023-10-01: 60 mg via ORAL
  Filled 2023-10-01: qty 3

## 2023-10-01 MED ORDER — LACTATED RINGERS IV SOLN
150.0000 mL/h | INTRAVENOUS | Status: DC
  Administered 2023-10-01: 150 mL/h via INTRAVENOUS

## 2023-10-01 MED ORDER — IPRATROPIUM-ALBUTEROL 0.5-2.5 (3) MG/3ML IN SOLN
RESPIRATORY_TRACT | Status: AC
  Filled 2023-10-01: qty 3

## 2023-10-01 MED ORDER — SODIUM CHLORIDE 0.9 % IV SOLN
2.0000 g | INTRAVENOUS | Status: DC
  Administered 2023-10-03: 2 g via INTRAVENOUS
  Filled 2023-10-01 (×2): qty 20

## 2023-10-01 MED ORDER — IPRATROPIUM-ALBUTEROL 0.5-2.5 (3) MG/3ML IN SOLN
3.0000 mL | Freq: Four times a day (QID) | RESPIRATORY_TRACT | Status: DC
  Administered 2023-10-01 – 2023-10-03 (×6): 3 mL via RESPIRATORY_TRACT
  Filled 2023-10-01 (×4): qty 3

## 2023-10-01 MED ORDER — LACTATED RINGERS IV BOLUS
1000.0000 mL | Freq: Once | INTRAVENOUS | Status: AC
  Administered 2023-10-01: 1000 mL via INTRAVENOUS

## 2023-10-01 MED ORDER — IPRATROPIUM-ALBUTEROL 0.5-2.5 (3) MG/3ML IN SOLN
3.0000 mL | RESPIRATORY_TRACT | Status: DC | PRN
  Administered 2023-10-02 – 2023-10-03 (×2): 3 mL via RESPIRATORY_TRACT
  Filled 2023-10-01 (×4): qty 3

## 2023-10-01 MED ORDER — IPRATROPIUM-ALBUTEROL 0.5-2.5 (3) MG/3ML IN SOLN
3.0000 mL | Freq: Once | RESPIRATORY_TRACT | Status: AC
  Administered 2023-10-01: 3 mL via RESPIRATORY_TRACT
  Filled 2023-10-01: qty 3

## 2023-10-01 MED ORDER — SODIUM CHLORIDE 0.9 % IV SOLN
500.0000 mg | INTRAVENOUS | Status: DC
  Administered 2023-10-01 – 2023-10-03 (×3): 500 mg via INTRAVENOUS
  Filled 2023-10-01 (×2): qty 5

## 2023-10-01 MED ORDER — ACETAMINOPHEN 325 MG PO TABS: 650.0000 mg | ORAL_TABLET | Freq: Four times a day (QID) | ORAL | Status: DC | PRN

## 2023-10-01 MED ORDER — ONDANSETRON HCL 4 MG PO TABS: 4.0000 mg | ORAL_TABLET | Freq: Four times a day (QID) | ORAL | Status: DC | PRN

## 2023-10-01 MED ORDER — ONDANSETRON HCL 4 MG/2ML IJ SOLN: 4.0000 mg | Freq: Four times a day (QID) | INTRAMUSCULAR | Status: DC | PRN

## 2023-10-01 MED ORDER — IPRATROPIUM-ALBUTEROL 0.5-2.5 (3) MG/3ML IN SOLN
9.0000 mL | Freq: Once | RESPIRATORY_TRACT | Status: AC
  Administered 2023-10-01: 9 mL via RESPIRATORY_TRACT
  Filled 2023-10-01: qty 3

## 2023-10-01 MED ORDER — SODIUM CHLORIDE 0.9 % IV SOLN
2.0000 g | INTRAVENOUS | Status: DC
  Administered 2023-10-01: 2 g via INTRAVENOUS
  Filled 2023-10-01: qty 20

## 2023-10-01 MED ORDER — ACETAMINOPHEN 650 MG RE SUPP: 650.0000 mg | Freq: Four times a day (QID) | RECTAL | Status: DC | PRN

## 2023-10-01 MED ORDER — MAGNESIUM HYDROXIDE 400 MG/5ML PO SUSP
30.0000 mL | Freq: Every day | ORAL | Status: DC | PRN
  Administered 2023-10-03: 30 mL via ORAL
  Filled 2023-10-01: qty 30

## 2023-10-01 MED ORDER — TRAZODONE HCL 50 MG PO TABS
25.0000 mg | ORAL_TABLET | Freq: Every evening | ORAL | Status: DC | PRN
  Filled 2023-10-01: qty 1

## 2023-10-01 MED ORDER — TIOTROPIUM BROMIDE MONOHYDRATE 18 MCG IN CAPS
18.0000 ug | ORAL_CAPSULE | Freq: Every day | RESPIRATORY_TRACT | Status: DC
  Administered 2023-10-02 – 2023-10-03 (×2): 18 ug via RESPIRATORY_TRACT
  Filled 2023-10-01: qty 5

## 2023-10-01 MED ORDER — MECLIZINE HCL 25 MG PO TABS
25.0000 mg | ORAL_TABLET | Freq: Three times a day (TID) | ORAL | Status: DC | PRN
  Administered 2023-10-02: 25 mg via ORAL
  Filled 2023-10-01: qty 1

## 2023-10-01 MED ORDER — SODIUM CHLORIDE 0.9 % IV SOLN
500.0000 mg | INTRAVENOUS | Status: DC
  Filled 2023-10-01: qty 5

## 2023-10-01 NOTE — ED Provider Triage Note (Signed)
Emergency Medicine Provider Triage Evaluation Note  Kimberly Berger , a 72 y.o. female  was evaluated in triage.  Pt complains of SOB that began on Sunday.  Review of Systems  Positive: SOB, nausea, chest soreness, cough Negative: CP, vomiting  Physical Exam  There were no vitals taken for this visit. Gen:   Awake, no distress   Resp:  Normal effort  MSK:   Moves extremities without difficulty  Other:    Medical Decision Making  Medically screening exam initiated at 6:05 PM.  Appropriate orders placed.  Kimberly Berger was informed that the remainder of the evaluation will be completed by another provider, this initial triage assessment does not replace that evaluation, and the importance of remaining in the ED until their evaluation is complete.    Cameron Ali, PA-C 10/01/23 1807

## 2023-10-01 NOTE — Assessment & Plan Note (Addendum)
-   We will place the patient IV steroid therapy with IV Solu-Medrol as well as nebulized bronchodilator therapy with duonebs q.i.d. and q.4 hours p.r.n.Marland Kitchen - Mucolytic therapy will be provided with Mucinex and antibiotic therapy with IV Rocephin and Zithromax. - O2 protocol will be followed. - We will hold off long-acting beta agonist and continue Spiriva.

## 2023-10-01 NOTE — ED Notes (Signed)
Pt placed on 2 liters oxygen [place] in triage, pt taken to xray via wheelchair.

## 2023-10-01 NOTE — ED Provider Notes (Signed)
[facility] Provider Note    Event Date/Time   First MD Initiated Contact with Patient 10/01/23 1933     (approximate)   History   Chief Complaint Shortness of Breath   HPI  Kimberly Berger is a 72 y.o. female with past medical history of hypertension and COPD who presents to the ED complaining of shortness of breath.  Patient reports that she has been coughing up black sputum for the past couple of days and has been feeling increasingly short of breath during this time.  She denies any fevers or pain in her chest, has not had any pain or swelling in her legs.  She reports similar symptoms with COPD exacerbation in the past, continues to smoke cigarettes.     Physical Exam   Triage Vital Signs: ED Triage Vitals  Encounter Vitals Group     BP 10/01/23 1809 125/62     Systolic BP Percentile --      Diastolic BP Percentile --      Pulse Rate 10/01/23 1809 (!) 142     Resp 10/01/23 1809 (!) 28     Temp 10/01/23 1809 98 F (36.7 C)     Temp Source 10/01/23 1809 Oral     SpO2 10/01/23 1809 (!) 86 %     Weight 10/01/23 1806 130 lb (59 kg)     Height 10/01/23 1806 5\' 4"  (1.626 m)     Head Circumference --      Peak Flow --      Pain Score 10/01/23 1806 10     Pain Loc --      Pain Education --      Exclude from Growth Chart --     Most recent vital signs: Vitals:   10/01/23 1932 10/01/23 2000  BP:  (!) 131/58  Pulse: (!) 116 (!) 107  Resp: (!) 26 (!) 30  Temp:    SpO2: 92% 92%    Constitutional: Alert and oriented. Eyes: Conjunctivae are normal. Head: Atraumatic. Nose: No congestion/rhinnorhea. Mouth/Throat: Mucous membranes are moist.  Cardiovascular: Tachycardic, regular rhythm. Grossly normal heart sounds.  2+ radial pulses bilaterally. Respiratory: Tachypneic with increased respiratory effort, expiratory wheezing throughout. Gastrointestinal: Soft and nontender. No distention. Musculoskeletal: No lower extremity tenderness nor edema.   Neurologic:  Normal speech and language. No gross focal neurologic deficits are appreciated.    ED Results / Procedures / Treatments   Labs (all labs ordered are listed, but only abnormal results are displayed) Labs Reviewed  CBC WITH DIFFERENTIAL/PLATELET - Abnormal; Notable for the following components:      Result Value   WBC 13.8 (*)    Neutro Abs 12.0 (*)    Abs Immature Granulocytes 0.09 (*)    All other components within normal limits  BASIC METABOLIC PANEL - Abnormal; Notable for the following components:   Chloride 96 (*)    Glucose, Bld 128 (*)    Calcium 8.6 (*)    All other components within normal limits  RESP PANEL BY RT-PCR (RSV, FLU A&B, COVID)  RVPGX2  CULTURE, BLOOD (ROUTINE X 2)  CULTURE, BLOOD (ROUTINE X 2) W REFLEX TO ID PANEL  BRAIN NATRIURETIC PEPTIDE  LACTIC ACID, PLASMA  LACTIC ACID, PLASMA  HIV ANTIBODY (ROUTINE TESTING W REFLEX)  PROTIME-INR  CORTISOL-AM, BLOOD  PROCALCITONIN  BASIC METABOLIC PANEL  CBC  TROPONIN I (HIGH SENSITIVITY)     EKG  ED ECG REPORT I, Chesley Noon, the attending physician, personally viewed and  interpreted this ECG.   Date: 10/01/2023  EKG Time: 18:10  Rate: 137  Rhythm: sinus tachycardia  Axis: Normal  Intervals:none  ST&T Change: None  RADIOLOGY Chest x-ray reviewed and interpreted by me with bilateral basilar infiltrates.  PROCEDURES:  Critical Care performed: Yes, see critical care procedure note(s)  .Critical Care  Performed by: Chesley Noon, MD Authorized by: Chesley Noon, MD   Critical care provider statement:    Critical care time (minutes):  30   Critical care time was exclusive of:  Separately billable procedures and treating other patients and teaching time   Critical care was necessary to treat or prevent imminent or life-threatening deterioration of the following conditions:  Respiratory failure   Critical care was time spent personally by me on the following activities:   Development of treatment plan with patient or surrogate, discussions with consultants, evaluation of patient's response to treatment, examination of patient, ordering and review of laboratory studies, ordering and review of radiographic studies, ordering and performing treatments and interventions, pulse oximetry, re-evaluation of patient's condition and review of old charts   I assumed direction of critical care for this patient from another provider in my specialty: no     Care discussed with: admitting provider      MEDICATIONS ORDERED IN ED: Medications  azithromycin (ZITHROMAX) 500 mg in sodium chloride 0.9 % 250 mL IVPB (has no administration in time range)  aspirin EC tablet 325 mg (has no administration in time range)  amLODipine (NORVASC) tablet 5 mg (has no administration in time range)  meclizine (ANTIVERT) tablet 25 mg (has no administration in time range)  tiotropium (SPIRIVA) inhalation capsule ([facility] use ONLY) 18 mcg (has no administration in time range)  ipratropium-albuterol (DUONEB) 0.5-2.5 (3) MG/3ML nebulizer solution 3 mL (has no administration in time range)  lactated ringers infusion (has no administration in time range)  enoxaparin (LOVENOX) injection 40 mg (has no administration in time range)  cefTRIAXone (ROCEPHIN) 2 g in sodium chloride 0.9 % 100 mL IVPB (has no administration in time range)  azithromycin (ZITHROMAX) 500 mg in sodium chloride 0.9 % 250 mL IVPB (has no administration in time range)  acetaminophen (TYLENOL) tablet 650 mg (has no administration in time range)    Or  acetaminophen (TYLENOL) suppository 650 mg (has no administration in time range)  traZODone (DESYREL) tablet 25 mg (has no administration in time range)  magnesium hydroxide (MILK OF MAGNESIA) suspension 30 mL (has no administration in time range)  ondansetron (ZOFRAN) tablet 4 mg (has no administration in time range)    Or  ondansetron (ZOFRAN) injection 4 mg (has no administration in  time range)  guaiFENesin (MUCINEX) 12 hr tablet 600 mg (has no administration in time range)  chlorpheniramine-HYDROcodone (TUSSIONEX) 10-8 MG/5ML suspension 5 mL (has no administration in time range)  ipratropium-albuterol (DUONEB) 0.5-2.5 (3) MG/3ML nebulizer solution 3 mL (has no administration in time range)  ipratropium-albuterol (DUONEB) 0.5-2.5 (3) MG/3ML nebulizer solution 9 mL (9 mLs Nebulization Given 10/01/23 1951)  predniSONE (DELTASONE) tablet 60 mg (60 mg Oral Given 10/01/23 1951)  lactated ringers bolus 1,000 mL (1,000 mLs Intravenous Bolus from Bag 10/01/23 2033)  ipratropium-albuterol (DUONEB) 0.5-2.5 (3) MG/3ML nebulizer solution 3 mL (3 mLs Nebulization Given 10/01/23 2109)     IMPRESSION / MDM / ASSESSMENT AND PLAN / ED COURSE  I reviewed the triage vital signs and the nursing notes.  72 y.o. female with past medical history of hypertension and COPD who presents to the ED with cough Precht of of black sputum with increasing difficulty breathing over the past 2 days.  Patient's presentation is most consistent with acute presentation with potential threat to life or bodily function.  Differential diagnosis includes, but is not limited to, COPD exacerbation, CHF, ACS, PE, pneumonia, pneumothorax, anemia, electrolyte abnormality, AKI.  Patient nontoxic-appearing and in no acute distress, does have increased work of breathing and tachypnea.  She was noted to be hypoxic in triage, placed on 2 L nasal cannula with improvement.  EKG shows sinus tachycardia with no ischemic changes, troponin within normal limits and I doubt ACS or PE.  With her wheezing, suspect COPD exacerbation and we will treat with DuoNeb as well as prednisone.  Chest x-ray concerning for bibasilar pneumonia per radiology, although this has appearance suggestive of viral illness.  COVID and flu testing pending at this time, patient does have leukocytosis and meets sepsis criteria.  No  significant anemia, electrolyte abnormality, or AKI noted.  BNP within normal limits and I doubt CHF.  Chest x-ray concerning for bibasilar infiltrates and patient meets sepsis criteria, will start on IV antibiotics and draw blood cultures.  She continues to be tachypneic and tachycardic following DuoNeb, will give additional albuterol and case discussed with hospitalist for admission.      FINAL CLINICAL IMPRESSION(S) / ED DIAGNOSES   Final diagnoses:  Sepsis without acute organ dysfunction, due to unspecified organism (HCC)  COPD exacerbation (HCC)  Acute respiratory failure with hypoxia (HCC)     Rx / DC Orders   ED Discharge Orders     None        Note:  This document was prepared using Dragon voice recognition software and may include unintentional dictation errors.   Chesley Noon, MD 10/01/23 2132

## 2023-10-01 NOTE — ED Triage Notes (Signed)
Pt reports sob for 3 days.  No chest pain.  No v/d  pt reports nausea.   Hx cig smoking.  Pt reports a cough.  Pt alert  speech clear.

## 2023-10-01 NOTE — Assessment & Plan Note (Addendum)
-   O2 protocol will be followed. - This is likely secondary to #1 and #2. - Management otherwise as above.

## 2023-10-01 NOTE — Sepsis Progress Note (Signed)
 Elink monitoring for the code sepsis protocol.  Notified bedside nurse of need to administer antibiotics.

## 2023-10-01 NOTE — Assessment & Plan Note (Signed)
-  The patient was counseled for smoking cessation and will receive further counseling here.

## 2023-10-01 NOTE — H&P (Addendum)
[place]   PATIENT NAME: Kimberly Berger    MR#:  [id number]  DATE OF BIRTH:  1951-03-09  DATE OF ADMISSION:  10/01/2023  PRIMARY CARE PHYSICIAN: Sandrea Hughs, NP   Patient is coming from: Home  REQUESTING/REFERRING PHYSICIAN: Chesley Noon, MD  CHIEF COMPLAINT:   Chief Complaint  Patient presents with   Shortness of Breath    HISTORY OF PRESENT ILLNESS:  Kimberly Berger is a 72 y.o. female with medical history significant for COPD and hypertension, who presented to the emergency room with acute onset of dyspnea with associated cough productive of black sputum over the last couple days.  This has been associated with worsening dyspnea and wheezing.  She denies any fever or chills.  No chest pain or palpitations.  She denies leg pain or edema or recent travels or surgeries.  She continues to smoke half a pack service per day but stated that she quit 2 to 3 days ago.  No nausea or vomiting or abdominal pain.  No dysuria, oliguria or hematuria or flank pain.  ED Course: Upon presentation to the emergency room, heart rate was 142 and respiratory rate was 28 with pulse symmetry of 86% on room air.This is, up to 92 and later 98% on 2 L O2 by nasal cannula.  Labs revealed a chloride of 96 and glucose of 128 with a calcium of 8.6, high sensitive troponin I of 11, BNP of 66, lactic acid  1.8.  CBC showed leukocytosis of 13.8 with neutrophilia. EKG as reviewed by me : EKG showed sinus tachycardia with rate 137 Imaging: Two-view chest x-ray showed bibasal interstitial opacities, left worse than right suspicious for atypical/viral infection.  The patient was given IV Rocephin and Zithromax, 2 DuoNebs, prednisone 60 mg and 1 L bolus of IV lactated ringer.  She will be admitted to a medical telemetry bed for further evaluation and management. PAST MEDICAL HISTORY:   Past Medical History:  Diagnosis Date   COPD (chronic obstructive pulmonary disease) (HCC)    Hypertension 12/12/2019    No pertinent past medical history     PAST SURGICAL HISTORY:   Past Surgical History:  Procedure Laterality Date   no surgery history      SOCIAL HISTORY:   Social History   Tobacco Use   Smoking status: Every Day    Current packs/day: 0.50    Average packs/day: 0.5 packs/day for 50.0 years (25.0 ttl pk-yrs)    Types: Cigarettes   Smokeless tobacco: Never  Substance Use Topics   Alcohol use: No    FAMILY HISTORY:   Family History  Problem Relation Age of Onset   Hypertension Father    Breast cancer Maternal Aunt     DRUG ALLERGIES:  No Known Allergies  REVIEW OF SYSTEMS:   ROS As per history of present illness. All pertinent systems were reviewed above. Constitutional, HEENT, cardiovascular, respiratory, GI, GU, musculoskeletal, neuro, psychiatric, endocrine, integumentary and hematologic systems were reviewed and are otherwise negative/unremarkable except for positive findings mentioned above in the HPI.   MEDICATIONS AT HOME:   Prior to Admission medications   Medication Sig Start Date End Date Taking? Authorizing Provider  albuterol (VENTOLIN HFA) 108 (90 Base) MCG/ACT inhaler Inhale 2 puffs into the lungs every 4 (four) hours as needed for wheezing or shortness of breath.    [provider]  amLODipine (NORVASC) 5 MG tablet Take 5 mg by mouth daily. 07/27/21   [provider]  aspirin  325 MG EC tablet Take 325 mg by mouth daily.    [provider]  meclizine (ANTIVERT) 25 MG tablet Take 1 tablet (25 mg total) by mouth 3 (three) times daily as needed for dizziness. 12/12/19   Minna Antis, MD  mometasone-formoterol (DULERA) 100-5 MCG/ACT AERO Inhale 2 puffs into the lungs 2 (two) times daily.    [provider]  tiotropium (SPIRIVA) 18 MCG inhalation capsule Place 18 mcg into inhaler and inhale daily.    [provider]      VITAL SIGNS:  Blood pressure (!) 131/58, pulse (!) 107, temperature 98 F (36.7 C),  temperature source Oral, resp. rate (!) 30, height 5\' 4"  (1.626 m), weight 59 kg, SpO2 92%.  PHYSICAL EXAMINATION:  Physical Exam  GENERAL:  72 y.o.-year-old African-American female patient semi-lying in the bed with mild respiratory distress with conversational dyspnea. EYES: Pupils equal, round, reactive to light and accommodation. No scleral icterus. Extraocular muscles intact.  HEENT: Head atraumatic, normocephalic. Oropharynx and nasopharynx clear.  NECK:  Supple, no jugular venous distention. No thyroid enlargement, no tenderness.  LUNGS: Diminished bibasal breath sounds with bibasal crackles and diffuse expiratory wheezes with tight expiratory airflow and harsh vesicular breathing.. No use of accessory muscles of respiration.  CARDIOVASCULAR: Regular rate and rhythm, S1, S2 normal. No murmurs, rubs, or gallops.  ABDOMEN: Soft, nondistended, nontender. Bowel sounds present. No organomegaly or mass.  EXTREMITIES: No pedal edema, cyanosis, or clubbing.  NEUROLOGIC: Cranial nerves II through XII are intact. Muscle strength 5/5 in all extremities. Sensation intact. Gait not checked.  PSYCHIATRIC: The patient is alert and oriented x 3.  Normal affect and good eye contact. SKIN: No obvious rash, lesion, or ulcer.   LABORATORY PANEL:   CBC Recent Labs  Lab 10/01/23 1811  WBC 13.8*  HGB 12.8  HCT 37.3  PLT 218   ------------------------------------------------------------------------------------------------------------------  Chemistries  Recent Labs  Lab 10/01/23 1811  NA 135  K 4.0  CL 96*  CO2 27  GLUCOSE 128*  BUN 11  CREATININE 0.69  CALCIUM 8.6*   ------------------------------------------------------------------------------------------------------------------  Cardiac Enzymes No results for input(s): "TROPONINI" in the last 168 hours. ------------------------------------------------------------------------------------------------------------------  RADIOLOGY:   DG Chest 2 View Result Date: 10/01/2023 CLINICAL DATA:  Shortness of breath EXAM: CHEST - 2 VIEW COMPARISON:  01/24/2023 FINDINGS: The heart size and mediastinal contours are within normal limits. Bibasilar interstitial opacities, left slightly worse than right. No pleural effusion or pneumothorax. The visualized skeletal structures are unremarkable. IMPRESSION: Bibasilar interstitial opacities, left slightly worse than right, suspicious for atypical/viral infection. Electronically Signed   By: Duanne Guess D.O.   On: 10/01/2023 20:01      IMPRESSION AND PLAN:  Assessment and Plan: * Sepsis due to pneumonia [facility]) - The patient will be admitted to a medical telemetry bed. - Sepsis manifested by tachycardia, tachypnea and leukocytosis. - Will continue antibiotic therapy with IV Rocephin and Zithromax. - Mucolytic therapy be provided as well as duo nebs q.i.d. and q.4 hours p.r.n. - We will follow blood cultures.   COPD with acute exacerbation (HCC) - We will place the patient IV steroid therapy with IV Solu-Medrol as well as nebulized bronchodilator therapy with duonebs q.i.d. and q.4 hours p.r.n.Marland Kitchen - Mucolytic therapy will be provided with Mucinex and antibiotic therapy with IV Rocephin and Zithromax. - O2 protocol will be followed. - We will hold off long-acting beta agonist and continue Spiriva.   Acute respiratory failure with hypoxia (HCC) - O2 protocol will be followed. -  This is likely secondary to #1 and #2. - Management otherwise as above.  Essential hypertension - We will continue antihypertensive therapy.  Tobacco abuse - The patient was counseled for smoking cessation and will receive further counseling here.    DVT prophylaxis: Lovenox.  Advanced Care Planning:  Code Status: The patient is DNR only.  This was discussed with her.  Family Communication:  The plan of care was discussed in details with the patient (and family). I answered all questions. The patient  agreed to proceed with the above mentioned plan. Further management will depend upon hospital course. Disposition Plan: Back to previous home environment Consults called: none.  All the records are reviewed and case discussed with ED provider.  Status is: Inpatient   At the time of the admission, it appears that the appropriate admission status for this patient is inpatient.  This is judged to be reasonable and necessary in order to provide the required intensity of service to ensure the patient's safety given the presenting symptoms, physical exam findings and initial radiographic and laboratory data in the context of comorbid conditions.  The patient requires inpatient status due to high intensity of service, high risk of further deterioration and high frequency of surveillance required.  I certify that at the time of admission, it is my clinical judgment that the patient will require inpatient hospital care extending more than 2 midnights.                            Dispo: The patient is from: Home              Anticipated d/c is to: Home              Patient currently is not medically stable to d/c.              Difficult to place patient: No  Hannah Beat M.D on 10/01/2023 at 9:36 PM  [facility]   From 7 PM-7 AM, contact night-coverage [email or web address]  CC: Primary care physician; Sandrea Hughs, NP

## 2023-10-01 NOTE — Assessment & Plan Note (Addendum)
-   The patient will be admitted to a medical telemetry bed. - Sepsis manifested by tachycardia, tachypnea and leukocytosis. - Will continue antibiotic therapy with IV Rocephin and Zithromax. - Mucolytic therapy be provided as well as duo nebs q.i.d. and q.4 hours p.r.n. - We will follow blood cultures.

## 2023-10-01 NOTE — Progress Notes (Signed)
CODE SEPSIS - PHARMACY COMMUNICATION  **Broad Spectrum Antibiotics should be administered within 1 hour of Sepsis diagnosis**  Time Code Sepsis Called/Page Received: 2057  Antibiotics Ordered: Azithromycin, ceftriaxone  Time of 1st antibiotic administration: 2109  Additional action taken by pharmacy: N/A  If necessary, Name of Provider/Nurse Contacted: N/A    Merryl Hacker ,PharmD Clinical Pharmacist  10/01/2023  8:57 PM

## 2023-10-01 NOTE — Assessment & Plan Note (Signed)
-   We will continue anti-hypertensive therapy. 

## 2023-10-02 DIAGNOSIS — J189 Pneumonia, unspecified organism: Secondary | ICD-10-CM | POA: Diagnosis not present

## 2023-10-02 DIAGNOSIS — A419 Sepsis, unspecified organism: Secondary | ICD-10-CM | POA: Diagnosis not present

## 2023-10-02 LAB — RESPIRATORY PANEL BY PCR

## 2023-10-02 LAB — CBC
HCT: 36.4 % (ref 36.0–46.0)
Hemoglobin: 12.1 g/dL (ref 12.0–15.0)
MCH: 32.4 pg (ref 26.0–34.0)
MCHC: 33.2 g/dL (ref 30.0–36.0)
MCV: 97.6 fL (ref 80.0–100.0)
Platelets: 198 10*3/uL (ref 150–400)
RBC: 3.73 MIL/uL — ABNORMAL LOW (ref 3.87–5.11)
RDW: 12.2 % (ref 11.5–15.5)
WBC: 12.7 10*3/uL — ABNORMAL HIGH (ref 4.0–10.5)
nRBC: 0 % (ref 0.0–0.2)

## 2023-10-02 LAB — PROTIME-INR
INR: 1 (ref 0.8–1.2)
Prothrombin Time: 13.6 s (ref 11.4–15.2)

## 2023-10-02 LAB — PROCALCITONIN: Procalcitonin: 0.31 ng/mL

## 2023-10-02 LAB — CORTISOL-AM, BLOOD: Cortisol - AM: 7.2 ug/dL (ref 6.7–22.6)

## 2023-10-02 LAB — BASIC METABOLIC PANEL
Anion gap: 9 (ref 5–15)
BUN: 11 mg/dL (ref 8–23)
CO2: 26 mmol/L (ref 22–32)
Calcium: 8.8 mg/dL — ABNORMAL LOW (ref 8.9–10.3)
Chloride: 105 mmol/L (ref 98–111)
Creatinine, Ser: 0.57 mg/dL (ref 0.44–1.00)
GFR, Estimated: >60 mL/min (ref >60)
Glucose, Bld: 159 mg/dL — ABNORMAL HIGH (ref 70–99)
Potassium: 4.4 mmol/L (ref 3.5–5.1)
Sodium: 140 mmol/L (ref 135–145)

## 2023-10-02 LAB — HIV ANTIBODY (ROUTINE TESTING W REFLEX): HIV Screen 4th Generation wRfx: NONREACTIVE

## 2023-10-02 MED ORDER — LACTATED RINGERS IV SOLN: INTRAVENOUS | Status: DC

## 2023-10-02 MED ORDER — PREDNISONE 20 MG PO TABS
40.0000 mg | ORAL_TABLET | Freq: Every day | ORAL | Status: DC
  Administered 2023-10-02 – 2023-10-03 (×2): 40 mg via ORAL
  Filled 2023-10-02 (×2): qty 2

## 2023-10-02 NOTE — Plan of Care (Signed)

## 2023-10-02 NOTE — Progress Notes (Signed)
PROGRESS NOTE    Kimberly Berger  WUX:[id number] DOB: March 24, 1951 DOA: 10/01/2023 PCP: Sandrea Hughs, NP    Brief Narrative:  72 y.o. female with medical history significant for COPD and hypertension, who presented to the emergency room with acute onset of dyspnea with associated cough productive of black sputum over the last couple days.  This has been associated with worsening dyspnea and wheezing.  She denies any fever or chills.  No chest pain or palpitations.  She denies leg pain or edema or recent travels or surgeries.  She continues to smoke half a pack service per day but stated that she quit 2 to 3 days ago.  No nausea or vomiting or abdominal pain.  No dysuria, oliguria or hematuria or flank pain.    Assessment & Plan:   Principal Problem:   Sepsis due to pneumonia [facility]) Active Problems:   COPD with acute exacerbation (HCC)   Acute respiratory failure with hypoxia (HCC)   Essential hypertension   Tobacco abuse   COPD exacerbation (HCC)  * Sepsis due to pneumonia (HCC) Likely occurred in the setting of COPD and continued tobacco use Sepsis criteria met by tachycardia, tachypnea, leukocytosis Plan: Continue Rocephin and azithromycin Bronchodilators Mucolytic's and antitussives as needed Monitor vitals and fever curve   COPD with acute exacerbation (HCC) Likely in the setting of active tobacco use Plan: Steroids Bronchodilators Mucolytic's Oxygen as needed   Acute respiratory failure with hypoxia (HCC) - O2 protocol will be followed. - This is likely secondary to above - Management otherwise as above.   Essential hypertension - We will continue antihypertensive therapy.   Tobacco abuse - The patient was counseled for smoking cessation and will receive further counseling here.   DVT prophylaxis: SQ Lovenox Code Status: DNR Family Communication: Daughter [name] at bedside 12/18 Disposition Plan: Status is: Inpatient Remains inpatient appropriate because:  COPD with concomitant pneumonia   Level of care: Telemetry Medical  Consultants:  None  Procedures:  None  Antimicrobials: Ceftriaxone Azithromycin   Subjective: Seen and examined.  Resting in bed.  Reports some shortness of breath but overall improving.  Objective: Vitals:   10/02/23 1130 10/02/23 1200 10/02/23 1230 10/02/23 1558  BP: 121/61 132/70 119/69 (!) 124/107  Pulse: 85 87 (!) 101 100  Resp: 14 (!) 29 18 20   Temp:    98.1 F (36.7 C)  TempSrc:      SpO2: 94% 96% 91% 96%  Weight:      Height:        Intake/Output Summary (Last 24 hours) at 10/02/2023 1603 Last data filed at 10/02/2023 0819 Gross per 24 hour  Intake 2550 ml  Output --  Net 2550 ml   Filed Weights   10/01/23 1806  Weight: 59 kg    Examination:  General exam: Appears calm and comfortable  Respiratory system: Scattered crackles bilaterally.  Expiratory wheeze.  Normal work of breathing.  2 L Cardiovascular system: S1-S2, tachycardic, regular rhythm, no murmurs, no pedal edema Gastrointestinal system: Soft, NT/ND, normal bowel sounds Central nervous system: Alert and oriented. No focal neurological deficits. Extremities: Symmetric 5 x 5 power. Skin: No rashes, lesions or ulcers Psychiatry: Judgement and insight appear normal. Mood & affect appropriate.     Data Reviewed: I have personally reviewed following labs and imaging studies  CBC: Recent Labs  Lab 10/01/23 1811 10/02/23 0620  WBC 13.8* 12.7*  NEUTROABS 12.0*  --   HGB 12.8 12.1  HCT 37.3 36.4  MCV 93.7 97.6  PLT 218 198   Basic Metabolic Panel: Recent Labs  Lab 10/01/23 1811 10/02/23 0620  NA 135 140  K 4.0 4.4  CL 96* 105  CO2 27 26  GLUCOSE 128* 159*  BUN 11 11  CREATININE 0.69 0.57  CALCIUM 8.6* 8.8*   GFR: Estimated Creatinine Clearance: 56.5 mL/min (by C-G formula based on SCr of 0.57 mg/dL). Liver Function Tests: No results for input(s): "AST", "ALT", "ALKPHOS", "BILITOT", "PROT", "ALBUMIN" in  the last 168 hours. No results for input(s): "LIPASE", "AMYLASE" in the last 168 hours. No results for input(s): "AMMONIA" in the last 168 hours. Coagulation Profile: No results for input(s): "INR", "PROTIME" in the last 168 hours. Cardiac Enzymes: No results for input(s): "CKTOTAL", "CKMB", "CKMBINDEX", "TROPONINI" in the last 168 hours. BNP (last 3 results) No results for input(s): "PROBNP" in the last 8760 hours. HbA1C: No results for input(s): "HGBA1C" in the last 72 hours. CBG: No results for input(s): "GLUCAP" in the last 168 hours. Lipid Profile: No results for input(s): "CHOL", "HDL", "LDLCALC", "TRIG", "CHOLHDL", "LDLDIRECT" in the last 72 hours. Thyroid Function Tests: No results for input(s): "TSH", "T4TOTAL", "FREET4", "T3FREE", "THYROIDAB" in the last 72 hours. Anemia Panel: No results for input(s): "VITAMINB12", "FOLATE", "FERRITIN", "TIBC", "IRON", "RETICCTPCT" in the last 72 hours. Sepsis Labs: Recent Labs  Lab 10/01/23 2035 10/02/23 0620  PROCALCITON  --  0.31  LATICACIDVEN 1.8  --     Recent Results (from the past 240 hours)  Resp panel by RT-PCR (RSV, Flu A&B, Covid) Anterior Nasal Swab     Status: None   Collection Time: 10/01/23  8:35 PM   Specimen: Anterior Nasal Swab  Result Value Ref Range Status   SARS Coronavirus 2 by RT PCR NEGATIVE NEGATIVE Final    Comment: (NOTE) SARS-CoV-2 target nucleic acids are NOT DETECTED.  The SARS-CoV-2 RNA is generally detectable in upper respiratory specimens during the acute phase of infection. The lowest concentration of SARS-CoV-2 viral copies this assay can detect is 138 copies/mL. A negative result does not preclude SARS-Cov-2 infection and should not be used as the sole basis for treatment or other patient management decisions. A negative result may occur with  improper specimen collection/handling, submission of specimen other than nasopharyngeal swab, presence of viral mutation(s) within the areas targeted  by this assay, and inadequate number of viral copies(<138 copies/mL). A negative result must be combined with clinical observations, patient history, and epidemiological information. The expected result is Negative.  Fact Sheet for Patients:  BloggerCourse.com  Fact Sheet for Healthcare Providers:  SeriousBroker.it  This test is no t yet approved or cleared by the [place] FDA and  has been authorized for detection and/or diagnosis of SARS-CoV-2 by FDA under an Emergency Use Authorization (EUA). This EUA will remain  in effect (meaning this test can be used) for the duration of the COVID-19 declaration under Section 564(b)(1) of the Act, 21 U.S.C.section 360bbb-3(b)(1), unless the authorization is terminated  or revoked sooner.       Influenza A by PCR NEGATIVE NEGATIVE Final   Influenza B by PCR NEGATIVE NEGATIVE Final    Comment: (NOTE) The Xpert Xpress SARS-CoV-2/FLU/RSV plus assay is intended as an aid in the diagnosis of influenza from Nasopharyngeal swab specimens and should not be used as a sole basis for treatment. Nasal washings and aspirates are unacceptable for Xpert Xpress SARS-CoV-2/FLU/RSV testing.  Fact Sheet for Patients: BloggerCourse.com  Fact Sheet for Healthcare Providers: SeriousBroker.it  This test is not yet approved or  cleared by the [place] and has been authorized for detection and/or diagnosis of SARS-CoV-2 by FDA under an Emergency Use Authorization (EUA). This EUA will remain in effect (meaning this test can be used) for the duration of the COVID-19 declaration under Section 564(b)(1) of the Act, 21 U.S.C. section 360bbb-3(b)(1), unless the authorization is terminated or revoked.     Resp Syncytial Virus by PCR NEGATIVE NEGATIVE Final    Comment: (NOTE) Fact Sheet for Patients: BloggerCourse.com  Fact  Sheet for Healthcare Providers: SeriousBroker.it  This test is not yet approved or cleared by the [place] FDA and has been authorized for detection and/or diagnosis of SARS-CoV-2 by FDA under an Emergency Use Authorization (EUA). This EUA will remain in effect (meaning this test can be used) for the duration of the COVID-19 declaration under Section 564(b)(1) of the Act, 21 U.S.C. section 360bbb-3(b)(1), unless the authorization is terminated or revoked.  Performed at [facility], [address] [place], [place] [address]   Culture, blood (routine x 2)     Status: None (Preliminary result)   Collection Time: 10/01/23  8:35 PM   Specimen: BLOOD  Result Value Ref Range Status   Specimen Description BLOOD BLOOD RIGHT ARM  Final   Special Requests   Final    BOTTLES DRAWN AEROBIC AND ANAEROBIC Blood Culture adequate volume   Culture   Final    NO GROWTH < 12 HOURS Performed at [facility], [address], [place], [place] [address]    Report Status PENDING  Incomplete         Radiology Studies: DG Chest 2 View Result Date: 10/01/2023 CLINICAL DATA:  Shortness of breath EXAM: CHEST - 2 VIEW COMPARISON:  01/24/2023 FINDINGS: The heart size and mediastinal contours are within normal limits. Bibasilar interstitial opacities, left slightly worse than right. No pleural effusion or pneumothorax. The visualized skeletal structures are unremarkable. IMPRESSION: Bibasilar interstitial opacities, left slightly worse than right, suspicious for atypical/viral infection. Electronically Signed   By: Duanne Guess D.O.   On: 10/01/2023 20:01        Scheduled Meds:  amLODipine  5 mg Oral Daily   aspirin EC  325 mg Oral Daily   enoxaparin (LOVENOX) injection  40 mg Subcutaneous Q24H   guaiFENesin  600 mg Oral BID   ipratropium-albuterol  3 mL Nebulization QID   predniSONE  40 mg Oral Q breakfast   tiotropium  18 mcg Inhalation Daily    Continuous Infusions:  azithromycin Stopped (10/02/23 0136)   cefTRIAXone (ROCEPHIN)  IV       LOS: 1 day     Tresa Moore, MD [facility]   If 7PM-7AM, please contact night-coverage  10/02/2023, 4:03 PM \

## 2023-10-02 NOTE — ED Notes (Signed)
This RN talked to pt daughter and updated her on plan of care, pt given phone to call her daughter. Pt denies needs at this time

## 2023-10-03 DIAGNOSIS — J189 Pneumonia, unspecified organism: Secondary | ICD-10-CM | POA: Diagnosis not present

## 2023-10-03 DIAGNOSIS — A419 Sepsis, unspecified organism: Secondary | ICD-10-CM | POA: Diagnosis not present

## 2023-10-03 LAB — CBC WITH DIFFERENTIAL/PLATELET
Abs Immature Granulocytes: 0.1 10*3/uL — ABNORMAL HIGH (ref 0.00–0.07)
Basophils Absolute: 0 10*3/uL (ref 0.0–0.1)
Basophils Relative: 0 %
Eosinophils Absolute: 0 10*3/uL (ref 0.0–0.5)
Eosinophils Relative: 0 %
HCT: 32.3 % — ABNORMAL LOW (ref 36.0–46.0)
Hemoglobin: 10.8 g/dL — ABNORMAL LOW (ref 12.0–15.0)
Immature Granulocytes: 1 %
Lymphocytes Relative: 26 %
Lymphs Abs: 3.6 10*3/uL (ref 0.7–4.0)
MCH: 32.4 pg (ref 26.0–34.0)
MCHC: 33.4 g/dL (ref 30.0–36.0)
MCV: 97 fL (ref 80.0–100.0)
Monocytes Absolute: 0.7 10*3/uL (ref 0.1–1.0)
Monocytes Relative: 5 %
Neutro Abs: 9.2 10*3/uL — ABNORMAL HIGH (ref 1.7–7.7)
Neutrophils Relative %: 68 %
Platelets: 218 10*3/uL (ref 150–400)
RBC: 3.33 MIL/uL — ABNORMAL LOW (ref 3.87–5.11)
RDW: 12.5 % (ref 11.5–15.5)
Smear Review: NORMAL
WBC: 13.7 10*3/uL — ABNORMAL HIGH (ref 4.0–10.5)
nRBC: 0 % (ref 0.0–0.2)

## 2023-10-03 LAB — BASIC METABOLIC PANEL
Anion gap: 10 (ref 5–15)
BUN: 10 mg/dL (ref 8–23)
CO2: 29 mmol/L (ref 22–32)
Calcium: 8.3 mg/dL — ABNORMAL LOW (ref 8.9–10.3)
Chloride: 99 mmol/L (ref 98–111)
Creatinine, Ser: 0.63 mg/dL (ref 0.44–1.00)
GFR, Estimated: >60 mL/min (ref >60)
Glucose, Bld: 102 mg/dL — ABNORMAL HIGH (ref 70–99)
Potassium: 3.5 mmol/L (ref 3.5–5.1)
Sodium: 138 mmol/L (ref 135–145)

## 2023-10-03 MED ORDER — IPRATROPIUM-ALBUTEROL 0.5-2.5 (3) MG/3ML IN SOLN: 3.0000 mL | Freq: Two times a day (BID) | RESPIRATORY_TRACT | Status: DC

## 2023-10-03 MED ORDER — METHYLPREDNISOLONE SODIUM SUCC 40 MG IJ SOLR
40.0000 mg | Freq: Two times a day (BID) | INTRAMUSCULAR | Status: DC
  Administered 2023-10-03 – 2023-10-04 (×3): 40 mg via INTRAVENOUS
  Filled 2023-10-03 (×3): qty 1

## 2023-10-03 MED ORDER — IPRATROPIUM-ALBUTEROL 0.5-2.5 (3) MG/3ML IN SOLN
3.0000 mL | Freq: Three times a day (TID) | RESPIRATORY_TRACT | Status: DC
  Administered 2023-10-03 – 2023-10-04 (×3): 3 mL via RESPIRATORY_TRACT
  Filled 2023-10-03 (×3): qty 3

## 2023-10-03 NOTE — Plan of Care (Signed)
  Problem: Fluid Volume: Goal: Hemodynamic stability will improve Outcome: Progressing   Problem: Clinical Measurements: Goal: Diagnostic test results will improve Outcome: Progressing   Problem: Respiratory: Goal: Ability to maintain adequate ventilation will improve Outcome: Progressing   Problem: Education: Goal: Knowledge of General Education information will improve Description: Including pain rating scale, medication(s)/side effects and non-pharmacologic comfort measures Outcome: Progressing   Problem: Clinical Measurements: Goal: Ability to maintain clinical measurements within normal limits will improve Outcome: Progressing Goal: Diagnostic test results will improve Outcome: Progressing Goal: Respiratory complications will improve Outcome: Progressing Goal: Cardiovascular complication will be avoided Outcome: Progressing   Problem: Activity: Goal: Risk for activity intolerance will decrease Outcome: Progressing   Problem: Nutrition: Goal: Adequate nutrition will be maintained Outcome: Progressing   Problem: Coping: Goal: Level of anxiety will decrease Outcome: Progressing   Problem: Elimination: Goal: Will not experience complications related to bowel motility Outcome: Progressing Goal: Will not experience complications related to urinary retention Outcome: Progressing   Problem: Pain Management: Goal: General experience of comfort will improve Outcome: Progressing   Problem: Safety: Goal: Ability to remain free from injury will improve Outcome: Progressing   Problem: Skin Integrity: Goal: Risk for impaired skin integrity will decrease Outcome: Progressing

## 2023-10-03 NOTE — Plan of Care (Signed)

## 2023-10-03 NOTE — Plan of Care (Signed)
  Problem: Respiratory: Goal: Ability to maintain adequate ventilation will improve Outcome: Progressing   Problem: Education: Goal: Knowledge of General Education information will improve Description: Including pain rating scale, medication(s)/side effects and non-pharmacologic comfort measures Outcome: Progressing   Problem: Clinical Measurements: Goal: Ability to maintain clinical measurements within normal limits will improve Outcome: Progressing   Problem: Activity: Goal: Risk for activity intolerance will decrease Outcome: Progressing   Problem: Nutrition: Goal: Adequate nutrition will be maintained Outcome: Progressing

## 2023-10-03 NOTE — Progress Notes (Signed)
PROGRESS NOTE    Kimberly Berger  ZOX:[id number] DOB: 07-Oct-1951 DOA: 10/01/2023 PCP: Kimberly Hughs, NP    Brief Narrative:  72 y.o. female with medical history significant for COPD and hypertension, who presented to the emergency room with acute onset of dyspnea with associated cough productive of black sputum over the last couple days.  This has been associated with worsening dyspnea and wheezing.  She denies any fever or chills.  No chest pain or palpitations.  She denies leg pain or edema or recent travels or surgeries.  She continues to smoke half a pack service per day but stated that she quit 2 to 3 days ago.  No nausea or vomiting or abdominal pain.  No dysuria, oliguria or hematuria or flank pain.    Assessment & Plan:   Principal Problem:   Sepsis due to pneumonia [facility]) Active Problems:   COPD with acute exacerbation (HCC)   Acute respiratory failure with hypoxia (HCC)   Essential hypertension   Tobacco abuse   COPD exacerbation (HCC)  * Sepsis due to pneumonia (HCC) Likely occurred in the setting of COPD and continued tobacco use Sepsis criteria met by tachycardia, tachypnea, leukocytosis Sepsis physiology improving Plan: Continue Rocephin and azithromycin Bronchodilators Mucolytic's and antitussives as needed Monitor vitals and fever curve   COPD with acute exacerbation (HCC) Likely in the setting of active tobacco use Plan: IV steroids Solu-Medrol 40 mg twice daily Bronchodilators Mucolytic's Oxygen as needed   Acute respiratory failure with hypoxia (HCC) Treatment for COPD and pneumonia as above Wean oxygen as tolerated   Essential hypertension Blood pressure controlled   Tobacco abuse Discussed cessation with patient and daughter   DVT prophylaxis: SQ Lovenox Code Status: DNR Family Communication: Daughter Kimberly Berger at bedside 12/18 Disposition Plan: Status is: Inpatient Remains inpatient appropriate because: COPD with concomitant  pneumonia   Level of care: Telemetry Medical  Consultants:  None  Procedures:  None  Antimicrobials: Ceftriaxone Azithromycin   Subjective: Seen and examined.  Sitting up on edge of bed eating.  No visible distress  Objective: Vitals:   10/02/23 1558 10/02/23 1945 10/02/23 2318 10/03/23 0747  BP: (!) 124/107  118/69 103/60  Pulse: 100  97 78  Resp: 20  18 18   Temp: 98.1 F (36.7 C)  98.4 F (36.9 C) 97.7 F (36.5 C)  TempSrc:      SpO2: 96% 96% 95% 97%  Weight:      Height:        Intake/Output Summary (Last 24 hours) at 10/03/2023 1124 Last data filed at 10/03/2023 0344 Gross per 24 hour  Intake 1339.79 ml  Output --  Net 1339.79 ml   Filed Weights   10/01/23 1806  Weight: 59 kg    Examination:  General exam: NAD D Respiratory system: Diffuse end expiratory wheeze.  Scattered crackles bilaterally.  Normal work of breathing.  2 L Cardiovascular system: S1-S2, RRR, no murmurs, no pedal edema Gastrointestinal system: Soft, NT/ND, normal bowel sounds Central nervous system: Alert and oriented. No focal neurological deficits. Extremities: Symmetric 5 x 5 power. Skin: No rashes, lesions or ulcers Psychiatry: Judgement and insight appear normal. Mood & affect appropriate.     Data Reviewed: I have personally reviewed following labs and imaging studies  CBC: Recent Labs  Lab 10/01/23 1811 10/02/23 0620 10/03/23 0830  WBC 13.8* 12.7* 13.7*  NEUTROABS 12.0*  --  9.2*  HGB 12.8 12.1 10.8*  HCT 37.3 36.4 32.3*  MCV 93.7 97.6 97.0  PLT 218  198 218   Basic Metabolic Panel: Recent Labs  Lab 10/01/23 1811 10/02/23 0620 10/03/23 0830  NA 135 140 138  K 4.0 4.4 3.5  CL 96* 105 99  CO2 27 26 29   GLUCOSE 128* 159* 102*  BUN 11 11 10   CREATININE 0.69 0.57 0.63  CALCIUM 8.6* 8.8* 8.3*   GFR: Estimated Creatinine Clearance: 56.5 mL/min (by C-G formula based on SCr of 0.63 mg/dL). Liver Function Tests: No results for input(s): "AST", "ALT",  "ALKPHOS", "BILITOT", "PROT", "ALBUMIN" in the last 168 hours. No results for input(s): "LIPASE", "AMYLASE" in the last 168 hours. No results for input(s): "AMMONIA" in the last 168 hours. Coagulation Profile: Recent Labs  Lab 10/02/23 1606  INR 1.0   Cardiac Enzymes: No results for input(s): "CKTOTAL", "CKMB", "CKMBINDEX", "TROPONINI" in the last 168 hours. BNP (last 3 results) No results for input(s): "PROBNP" in the last 8760 hours. HbA1C: No results for input(s): "HGBA1C" in the last 72 hours. CBG: No results for input(s): "GLUCAP" in the last 168 hours. Lipid Profile: No results for input(s): "CHOL", "HDL", "LDLCALC", "TRIG", "CHOLHDL", "LDLDIRECT" in the last 72 hours. Thyroid Function Tests: No results for input(s): "TSH", "T4TOTAL", "FREET4", "T3FREE", "THYROIDAB" in the last 72 hours. Anemia Panel: No results for input(s): "VITAMINB12", "FOLATE", "FERRITIN", "TIBC", "IRON", "RETICCTPCT" in the last 72 hours. Sepsis Labs: Recent Labs  Lab 10/01/23 2035 10/02/23 0620  PROCALCITON  --  0.31  LATICACIDVEN 1.8  --     Recent Results (from the past 240 hours)  Resp panel by RT-PCR (RSV, Flu A&B, Covid) Anterior Nasal Swab     Status: None   Collection Time: 10/01/23  8:35 PM   Specimen: Anterior Nasal Swab  Result Value Ref Range Status   SARS Coronavirus 2 by RT PCR NEGATIVE NEGATIVE Final    Comment: (NOTE) SARS-CoV-2 target nucleic acids are NOT DETECTED.  The SARS-CoV-2 RNA is generally detectable in upper respiratory specimens during the acute phase of infection. The lowest concentration of SARS-CoV-2 viral copies this assay can detect is 138 copies/mL. A negative result does not preclude SARS-Cov-2 infection and should not be used as the sole basis for treatment or other patient management decisions. A negative result may occur with  improper specimen collection/handling, submission of specimen other than nasopharyngeal swab, presence of viral mutation(s)  within the areas targeted by this assay, and inadequate number of viral copies(<138 copies/mL). A negative result must be combined with clinical observations, patient history, and epidemiological information. The expected result is Negative.  Fact Sheet for Patients:  BloggerCourse.com  Fact Sheet for Healthcare Providers:  SeriousBroker.it  This test is no t yet approved or cleared by the [place] FDA and  has been authorized for detection and/or diagnosis of SARS-CoV-2 by FDA under an Emergency Use Authorization (EUA). This EUA will remain  in effect (meaning this test can be used) for the duration of the COVID-19 declaration under Section 564(b)(1) of the Act, 21 U.S.C.section 360bbb-3(b)(1), unless the authorization is terminated  or revoked sooner.       Influenza A by PCR NEGATIVE NEGATIVE Final   Influenza B by PCR NEGATIVE NEGATIVE Final    Comment: (NOTE) The Xpert Xpress SARS-CoV-2/FLU/RSV plus assay is intended as an aid in the diagnosis of influenza from Nasopharyngeal swab specimens and should not be used as a sole basis for treatment. Nasal washings and aspirates are unacceptable for Xpert Xpress SARS-CoV-2/FLU/RSV testing.  Fact Sheet for Patients: BloggerCourse.com  Fact Sheet for Healthcare Providers:  SeriousBroker.it  This test is not yet approved or cleared by the [place] and has been authorized for detection and/or diagnosis of SARS-CoV-2 by FDA under an Emergency Use Authorization (EUA). This EUA will remain in effect (meaning this test can be used) for the duration of the COVID-19 declaration under Section 564(b)(1) of the Act, 21 U.S.C. section 360bbb-3(b)(1), unless the authorization is terminated or revoked.     Resp Syncytial Virus by PCR NEGATIVE NEGATIVE Final    Comment: (NOTE) Fact Sheet for  Patients: BloggerCourse.com  Fact Sheet for Healthcare Providers: SeriousBroker.it  This test is not yet approved or cleared by the [place] FDA and has been authorized for detection and/or diagnosis of SARS-CoV-2 by FDA under an Emergency Use Authorization (EUA). This EUA will remain in effect (meaning this test can be used) for the duration of the COVID-19 declaration under Section 564(b)(1) of the Act, 21 U.S.C. section 360bbb-3(b)(1), unless the authorization is terminated or revoked.  Performed at [facility], [address] [place], [place] [address]   Culture, blood (routine x 2)     Status: None (Preliminary result)   Collection Time: 10/01/23  8:35 PM   Specimen: BLOOD  Result Value Ref Range Status   Specimen Description BLOOD BLOOD RIGHT ARM  Final   Special Requests   Final    BOTTLES DRAWN AEROBIC AND ANAEROBIC Blood Culture adequate volume   Culture   Final    NO GROWTH 2 DAYS Performed at [facility], [address] [place], [place] [address]    Report Status PENDING  Incomplete  Respiratory (~20 pathogens) panel by PCR     Status: None   Collection Time: 10/02/23  8:11 AM   Specimen: Nasopharyngeal Swab; Respiratory  Result Value Ref Range Status   Adenovirus NOT DETECTED NOT DETECTED Final   Coronavirus 229E NOT DETECTED NOT DETECTED Final    Comment: (NOTE) The Coronavirus on the Respiratory Panel, DOES NOT test for the novel  Coronavirus (2019 nCoV)    Coronavirus HKU1 NOT DETECTED NOT DETECTED Final   Coronavirus NL63 NOT DETECTED NOT DETECTED Final   Coronavirus OC43 NOT DETECTED NOT DETECTED Final   Metapneumovirus NOT DETECTED NOT DETECTED Final   Rhinovirus / Enterovirus NOT DETECTED NOT DETECTED Final   Influenza A NOT DETECTED NOT DETECTED Final   Influenza B NOT DETECTED NOT DETECTED Final   Parainfluenza Virus 1 NOT DETECTED NOT DETECTED Final   Parainfluenza Virus  2 NOT DETECTED NOT DETECTED Final   Parainfluenza Virus 3 NOT DETECTED NOT DETECTED Final   Parainfluenza Virus 4 NOT DETECTED NOT DETECTED Final   Respiratory Syncytial Virus NOT DETECTED NOT DETECTED Final   Bordetella pertussis NOT DETECTED NOT DETECTED Final   Bordetella Parapertussis NOT DETECTED NOT DETECTED Final   Chlamydophila pneumoniae NOT DETECTED NOT DETECTED Final   Mycoplasma pneumoniae NOT DETECTED NOT DETECTED Final    Comment: Performed at [facility], [address], [place], [place] [address]  Culture, blood (Routine X 2) w Reflex to ID Panel     Status: None (Preliminary result)   Collection Time: 10/02/23  7:55 PM   Specimen: BLOOD  Result Value Ref Range Status   Specimen Description BLOOD BLOOD LEFT HAND  Final   Special Requests   Final    BOTTLES DRAWN AEROBIC AND ANAEROBIC Blood Culture adequate volume   Culture   Final    NO GROWTH < 12 HOURS Performed at [facility], [address] [place], [place] [address]    Report Status PENDING  Incomplete         Radiology Studies: DG Chest 2 View Result Date: 10/01/2023 CLINICAL DATA:  Shortness of breath EXAM: CHEST - 2 VIEW COMPARISON:  01/24/2023 FINDINGS: The heart size and mediastinal contours are within normal limits. Bibasilar interstitial opacities, left slightly worse than right. No pleural effusion or pneumothorax. The visualized skeletal structures are unremarkable. IMPRESSION: Bibasilar interstitial opacities, left slightly worse than right, suspicious for atypical/viral infection. Electronically Signed   By: Duanne Guess D.O.   On: 10/01/2023 20:01        Scheduled Meds:  amLODipine  5 mg Oral Daily   aspirin EC  325 mg Oral Daily   enoxaparin (LOVENOX) injection  40 mg Subcutaneous Q24H   guaiFENesin  600 mg Oral BID   ipratropium-albuterol  3 mL Nebulization BID   methylPREDNISolone (SOLU-MEDROL) injection  40 mg Intravenous Q12H   tiotropium  18 mcg Inhalation Daily    Continuous Infusions:  azithromycin 500 mg (10/02/23 2303)   cefTRIAXone (ROCEPHIN)  IV 200 mL/hr at 10/02/23 2231     LOS: 2 days     Tresa Moore, MD [facility]   If 7PM-7AM, please contact night-coverage  10/03/2023, 11:24 AM \

## 2023-10-04 DIAGNOSIS — J189 Pneumonia, unspecified organism: Secondary | ICD-10-CM | POA: Diagnosis not present

## 2023-10-04 DIAGNOSIS — A419 Sepsis, unspecified organism: Secondary | ICD-10-CM | POA: Diagnosis not present

## 2023-10-04 MED ORDER — GUAIFENESIN ER 600 MG PO TB12: 600.0000 mg | ORAL_TABLET | Freq: Two times a day (BID) | ORAL | 0 refills | Status: AC

## 2023-10-04 MED ORDER — METHYLPREDNISOLONE SODIUM SUCC 40 MG IJ SOLR
40.0000 mg | Freq: Every day | INTRAMUSCULAR | Status: DC
Start: 2023-10-05 — End: 2023-10-04

## 2023-10-04 MED ORDER — PREDNISONE 20 MG PO TABS: 40.0000 mg | ORAL_TABLET | Freq: Every day | ORAL | 0 refills | Status: AC

## 2023-10-04 MED ORDER — AMOXICILLIN-POT CLAVULANATE 875-125 MG PO TABS: 1.0000 | ORAL_TABLET | Freq: Two times a day (BID) | ORAL | 0 refills | Status: AC

## 2023-10-04 NOTE — Progress Notes (Signed)
PT Cancellation Note  Patient Details Name: Kimberly Berger MRN: [id number] DOB: 26-May-1951   Cancelled Treatment:    Reason Eval/Treat Not Completed: PT screened, no needs identified, will sign off. Patient is independent in room. States she has no needs. Plans to walk around nursing unit later.   [name] 10/04/2023, 10:29 AM

## 2023-10-04 NOTE — Care Management Important Message (Signed)
Important Message  Patient Details  Name: Kimberly Berger MRN: [id number] Date of Birth: [birth year]   Important Message Given:  Yes - Medicare IM     [name] 10/04/2023, 10:07 AM

## 2023-10-04 NOTE — Plan of Care (Signed)
  Problem: Fluid Volume: Goal: Hemodynamic stability will improve 10/04/2023 1009 by Calton Dach, RN Outcome: Progressing 10/04/2023 1009 by Calton Dach, RN Outcome: Progressing   Problem: Clinical Measurements: Goal: Diagnostic test results will improve 10/04/2023 1009 by Calton Dach, RN Outcome: Progressing 10/04/2023 1009 by Calton Dach, RN Outcome: Progressing Goal: Signs and symptoms of infection will decrease 10/04/2023 1009 by Calton Dach, RN Outcome: Progressing 10/04/2023 1009 by Calton Dach, RN Outcome: Progressing   Problem: Respiratory: Goal: Ability to maintain adequate ventilation will improve 10/04/2023 1009 by Calton Dach, RN Outcome: Progressing 10/04/2023 1009 by Calton Dach, RN Outcome: Progressing   Problem: Education: Goal: Knowledge of General Education information will improve Description: Including pain rating scale, medication(s)/side effects and non-pharmacologic comfort measures 10/04/2023 1009 by Calton Dach, RN Outcome: Progressing 10/04/2023 1009 by Calton Dach, RN Outcome: Progressing   Problem: Health Behavior/Discharge Planning: Goal: Ability to manage health-related needs will improve 10/04/2023 1009 by Calton Dach, RN Outcome: Progressing 10/04/2023 1009 by Calton Dach, RN Outcome: Progressing   Problem: Clinical Measurements: Goal: Ability to maintain clinical measurements within normal limits will improve 10/04/2023 1009 by Calton Dach, RN Outcome: Progressing 10/04/2023 1009 by Calton Dach, RN Outcome: Progressing Goal: Will remain free from infection 10/04/2023 1009 by Calton Dach, RN Outcome: Progressing 10/04/2023 1009 by Calton Dach, RN Outcome: Progressing Goal: Diagnostic test results will improve 10/04/2023 1009 by Calton Dach, RN Outcome: Progressing 10/04/2023 1009 by Calton Dach, RN Outcome: Progressing Goal: Respiratory complications will  improve 10/04/2023 1009 by Calton Dach, RN Outcome: Progressing 10/04/2023 1009 by Calton Dach, RN Outcome: Progressing Goal: Cardiovascular complication will be avoided 10/04/2023 1009 by Calton Dach, RN Outcome: Progressing 10/04/2023 1009 by Calton Dach, RN Outcome: Progressing   Problem: Activity: Goal: Risk for activity intolerance will decrease Outcome: Progressing   Problem: Nutrition: Goal: Adequate nutrition will be maintained Outcome: Progressing   Problem: Coping: Goal: Level of anxiety will decrease Outcome: Progressing   Problem: Elimination: Goal: Will not experience complications related to bowel motility Outcome: Progressing Goal: Will not experience complications related to urinary retention Outcome: Progressing   Problem: Pain Management: Goal: General experience of comfort will improve Outcome: Progressing   Problem: Safety: Goal: Ability to remain free from injury will improve Outcome: Progressing   Problem: Skin Integrity: Goal: Risk for impaired skin integrity will decrease Outcome: Progressing

## 2023-10-04 NOTE — Discharge Summary (Signed)
Physician Discharge Summary  Kimberly Berger AVW:[id number] DOB: 07/13/1951 DOA: 10/01/2023  PCP: Sandrea Hughs, NP  Admit date: 10/01/2023 Discharge date: 10/04/2023  Admitted From: Home Disposition:  Home  Recommendations for Outpatient Follow-up:  Follow up with PCP in 1-2 weeks Outpatient referral to pulmonology  Home Health: No Equipment/Devices: None  Discharge Condition: Stable CODE STATUS: DNR Diet recommendation: Heart healthy  Brief/Interim Summary:  72 y.o. female with medical history significant for COPD and hypertension, who presented to the emergency room with acute onset of dyspnea with associated cough productive of black sputum over the last couple days.  This has been associated with worsening dyspnea and wheezing.  She denies any fever or chills.  No chest pain or palpitations.  She denies leg pain or edema or recent travels or surgeries.  She continues to smoke half a pack service per day but stated that she quit 2 to 3 days ago.  No nausea or vomiting or abdominal pain.  No dysuria, oliguria or hematuria or flank pain.     Discharge Diagnoses:  Principal Problem:   Sepsis due to pneumonia [facility]) Active Problems:   COPD with acute exacerbation (HCC)   Acute respiratory failure with hypoxia (HCC)   Essential hypertension   Tobacco abuse   COPD exacerbation (HCC)   Sepsis due to pneumonia (HCC) COPD with acute exacerbation Acute respiratory failure with hypoxia, resolved Tobacco abuse Likely occurred in the setting of COPD and continued tobacco use Sepsis criteria met by tachycardia, tachypnea, leukocytosis Sepsis physiology improving Plan: Discontinue Rocephin and azithromycin.  Start Augmentin to complete total 5-day antibiotic course.  Prednisone 40 mg a day x 5 days to start 12/21.  Resume home inhaler regimen with ProAir and Dulera.  Outpatient referral to pulmonology placed at time of DC.  On room air at time of DC.  Smoking cessation discussed at  time of discharge    Discharge Instructions  Discharge Instructions     Ambulatory Referral for Lung Cancer Scre   Complete by: As directed    Ambulatory referral to Pulmonology   Complete by: As directed    Reason for referral: Asthma/COPD   Diet - low sodium heart healthy   Complete by: As directed    Increase activity slowly   Complete by: As directed       Allergies as of 10/04/2023   No Known Allergies      Medication List     STOP taking these medications    aspirin EC 325 MG tablet       TAKE these medications    albuterol 108 (90 Base) MCG/ACT inhaler Commonly known as: VENTOLIN HFA Inhale 2 puffs into the lungs every 4 (four) hours as needed for wheezing or shortness of breath.   amLODipine 5 MG tablet Commonly known as: NORVASC Take 5 mg by mouth daily.   amoxicillin-clavulanate 875-125 MG tablet Commonly known as: AUGMENTIN Take 1 tablet by mouth 2 (two) times daily for 3 days.   guaiFENesin 600 MG 12 hr tablet Commonly known as: MUCINEX Take 1 tablet (600 mg total) by mouth 2 (two) times daily.   guaiFENesin-codeine 100-10 MG/5ML syrup Take 10 mLs by mouth every 6 (six) hours as needed.   meclizine 25 MG tablet Commonly known as: ANTIVERT Take 1 tablet (25 mg total) by mouth 3 (three) times daily as needed for dizziness.   meloxicam 15 MG tablet Commonly known as: MOBIC Take 15 mg by mouth daily.   mometasone-formoterol 100-5 MCG/ACT Aero  Commonly known as: DULERA Inhale 2 puffs into the lungs 2 (two) times daily.   predniSONE 20 MG tablet Commonly known as: DELTASONE Take 2 tablets (40 mg total) by mouth daily with breakfast for 5 days.   tiotropium 18 MCG inhalation capsule Commonly known as: SPIRIVA Place 18 mcg into inhaler and inhale daily.        No Known Allergies  Consultations: None   Procedures/Studies: DG Chest 2 View Result Date: 10/01/2023 CLINICAL DATA:  Shortness of breath EXAM: CHEST - 2 VIEW  COMPARISON:  01/24/2023 FINDINGS: The heart size and mediastinal contours are within normal limits. Bibasilar interstitial opacities, left slightly worse than right. No pleural effusion or pneumothorax. The visualized skeletal structures are unremarkable. IMPRESSION: Bibasilar interstitial opacities, left slightly worse than right, suspicious for atypical/viral infection. Electronically Signed   By: Duanne Guess D.O.   On: 10/01/2023 20:01      Subjective: Seen and examined on the day of discharge.  Stable no distress.  Appropriate for discharge home.  Discharge Exam: Vitals:   10/04/23 0847 10/04/23 1200  BP: 136/65   Pulse: 83   Resp: 18   Temp: 97.8 F (36.6 C)   SpO2: 97% 94%   Vitals:   10/04/23 0112 10/04/23 0750 10/04/23 0847 10/04/23 1200  BP:   136/65   Pulse:   83   Resp:   18   Temp:   97.8 F (36.6 C)   TempSrc:      SpO2: 93% 97% 97% 94%  Weight:      Height:        General: Pt is alert, awake, not in acute distress Cardiovascular: RRR, S1/S2 +, no rubs, no gallops Respiratory: CTA bilaterally, no wheezing, no rhonchi Abdominal: Soft, NT, ND, bowel sounds + Extremities: no edema, no cyanosis    The results of significant diagnostics from this hospitalization (including imaging, microbiology, ancillary and laboratory) are listed below for reference.     Microbiology: Recent Results (from the past 240 hours)  Resp panel by RT-PCR (RSV, Flu A&B, Covid) Anterior Nasal Swab     Status: None   Collection Time: 10/01/23  8:35 PM   Specimen: Anterior Nasal Swab  Result Value Ref Range Status   SARS Coronavirus 2 by RT PCR NEGATIVE NEGATIVE Final    Comment: (NOTE) SARS-CoV-2 target nucleic acids are NOT DETECTED.  The SARS-CoV-2 RNA is generally detectable in upper respiratory specimens during the acute phase of infection. The lowest concentration of SARS-CoV-2 viral copies this assay can detect is 138 copies/mL. A negative result does not preclude  SARS-Cov-2 infection and should not be used as the sole basis for treatment or other patient management decisions. A negative result may occur with  improper specimen collection/handling, submission of specimen other than nasopharyngeal swab, presence of viral mutation(s) within the areas targeted by this assay, and inadequate number of viral copies(<138 copies/mL). A negative result must be combined with clinical observations, patient history, and epidemiological information. The expected result is Negative.  Fact Sheet for Patients:  BloggerCourse.com  Fact Sheet for Healthcare Providers:  SeriousBroker.it  This test is no t yet approved or cleared by the [place] FDA and  has been authorized for detection and/or diagnosis of SARS-CoV-2 by FDA under an Emergency Use Authorization (EUA). This EUA will remain  in effect (meaning this test can be used) for the duration of the COVID-19 declaration under Section 564(b)(1) of the Act, 21 U.S.C.section 360bbb-3(b)(1), unless the authorization is terminated  or revoked sooner.       Influenza A by PCR NEGATIVE NEGATIVE Final   Influenza B by PCR NEGATIVE NEGATIVE Final    Comment: (NOTE) The Xpert Xpress SARS-CoV-2/FLU/RSV plus assay is intended as an aid in the diagnosis of influenza from Nasopharyngeal swab specimens and should not be used as a sole basis for treatment. Nasal washings and aspirates are unacceptable for Xpert Xpress SARS-CoV-2/FLU/RSV testing.  Fact Sheet for Patients: BloggerCourse.com  Fact Sheet for Healthcare Providers: SeriousBroker.it  This test is not yet approved or cleared by the [place] FDA and has been authorized for detection and/or diagnosis of SARS-CoV-2 by FDA under an Emergency Use Authorization (EUA). This EUA will remain in effect (meaning this test can be used) for the duration of  the COVID-19 declaration under Section 564(b)(1) of the Act, 21 U.S.C. section 360bbb-3(b)(1), unless the authorization is terminated or revoked.     Resp Syncytial Virus by PCR NEGATIVE NEGATIVE Final    Comment: (NOTE) Fact Sheet for Patients: BloggerCourse.com  Fact Sheet for Healthcare Providers: SeriousBroker.it  This test is not yet approved or cleared by the [place] FDA and has been authorized for detection and/or diagnosis of SARS-CoV-2 by FDA under an Emergency Use Authorization (EUA). This EUA will remain in effect (meaning this test can be used) for the duration of the COVID-19 declaration under Section 564(b)(1) of the Act, 21 U.S.C. section 360bbb-3(b)(1), unless the authorization is terminated or revoked.  Performed at [facility], [address] [place], [place] [address]   Culture, blood (routine x 2)     Status: None (Preliminary result)   Collection Time: 10/01/23  8:35 PM   Specimen: BLOOD  Result Value Ref Range Status   Specimen Description BLOOD BLOOD RIGHT ARM  Final   Special Requests   Final    BOTTLES DRAWN AEROBIC AND ANAEROBIC Blood Culture adequate volume   Culture   Final    NO GROWTH 3 DAYS Performed at [facility], [address] [place], [place] [address]    Report Status PENDING  Incomplete  Respiratory (~20 pathogens) panel by PCR     Status: None   Collection Time: 10/02/23  8:11 AM   Specimen: Nasopharyngeal Swab; Respiratory  Result Value Ref Range Status   Adenovirus NOT DETECTED NOT DETECTED Final   Coronavirus 229E NOT DETECTED NOT DETECTED Final    Comment: (NOTE) The Coronavirus on the Respiratory Panel, DOES NOT test for the novel  Coronavirus (2019 nCoV)    Coronavirus HKU1 NOT DETECTED NOT DETECTED Final   Coronavirus NL63 NOT DETECTED NOT DETECTED Final   Coronavirus OC43 NOT DETECTED NOT DETECTED Final   Metapneumovirus NOT DETECTED NOT  DETECTED Final   Rhinovirus / Enterovirus NOT DETECTED NOT DETECTED Final   Influenza A NOT DETECTED NOT DETECTED Final   Influenza B NOT DETECTED NOT DETECTED Final   Parainfluenza Virus 1 NOT DETECTED NOT DETECTED Final   Parainfluenza Virus 2 NOT DETECTED NOT DETECTED Final   Parainfluenza Virus 3 NOT DETECTED NOT DETECTED Final   Parainfluenza Virus 4 NOT DETECTED NOT DETECTED Final   Respiratory Syncytial Virus NOT DETECTED NOT DETECTED Final   Bordetella pertussis NOT DETECTED NOT DETECTED Final   Bordetella Parapertussis NOT DETECTED NOT DETECTED Final   Chlamydophila pneumoniae NOT DETECTED NOT DETECTED Final   Mycoplasma pneumoniae NOT DETECTED NOT DETECTED Final    Comment: Performed at [facility], [address], [place], [place] [address]  Culture,  blood (Routine X 2) w Reflex to ID Panel     Status: None (Preliminary result)   Collection Time: 10/02/23  7:55 PM   Specimen: BLOOD  Result Value Ref Range Status   Specimen Description BLOOD BLOOD LEFT HAND  Final   Special Requests   Final    BOTTLES DRAWN AEROBIC AND ANAEROBIC Blood Culture adequate volume   Culture   Final    NO GROWTH 2 DAYS Performed at [facility], [address], [place], [place] [address]    Report Status PENDING  Incomplete     Labs: BNP (last 3 results) Recent Labs    10/01/23 1811  BNP 66.0   Basic Metabolic Panel: Recent Labs  Lab 10/01/23 1811 10/02/23 0620 10/03/23 0830  NA 135 140 138  K 4.0 4.4 3.5  CL 96* 105 99  CO2 27 26 29   GLUCOSE 128* 159* 102*  BUN 11 11 10   CREATININE 0.69 0.57 0.63  CALCIUM 8.6* 8.8* 8.3*   Liver Function Tests: No results for input(s): "AST", "ALT", "ALKPHOS", "BILITOT", "PROT", "ALBUMIN" in the last 168 hours. No results for input(s): "LIPASE", "AMYLASE" in the last 168 hours. No results for input(s): "AMMONIA" in the last 168 hours. CBC: Recent Labs  Lab 10/01/23 1811 10/02/23 0620 10/03/23 0830  WBC 13.8* 12.7* 13.7*   NEUTROABS 12.0*  --  9.2*  HGB 12.8 12.1 10.8*  HCT 37.3 36.4 32.3*  MCV 93.7 97.6 97.0  PLT 218 198 218   Cardiac Enzymes: No results for input(s): "CKTOTAL", "CKMB", "CKMBINDEX", "TROPONINI" in the last 168 hours. BNP: Invalid input(s): "POCBNP" CBG: No results for input(s): "GLUCAP" in the last 168 hours. D-Dimer No results for input(s): "DDIMER" in the last 72 hours. Hgb A1c No results for input(s): "HGBA1C" in the last 72 hours. Lipid Profile No results for input(s): "CHOL", "HDL", "LDLCALC", "TRIG", "CHOLHDL", "LDLDIRECT" in the last 72 hours. Thyroid function studies No results for input(s): "TSH", "T4TOTAL", "T3FREE", "THYROIDAB" in the last 72 hours.  Invalid input(s): "FREET3" Anemia work up No results for input(s): "VITAMINB12", "FOLATE", "FERRITIN", "TIBC", "IRON", "RETICCTPCT" in the last 72 hours. Urinalysis    Component Value Date/Time   COLORURINE YELLOW (A) 12/12/2019 1037   APPEARANCEUR HAZY (A) 12/12/2019 1037   APPEARANCEUR Cloudy 10/06/2012 1107   LABSPEC 1.023 12/12/2019 1037   LABSPEC 1.025 10/06/2012 1107   PHURINE 7.0 12/12/2019 1037   GLUCOSEU NEGATIVE 12/12/2019 1037   GLUCOSEU Negative 10/06/2012 1107   HGBUR NEGATIVE 12/12/2019 1037   BILIRUBINUR NEGATIVE 12/12/2019 1037   BILIRUBINUR neg 10/27/2019 1639   BILIRUBINUR Negative 10/06/2012 1107   KETONESUR NEGATIVE 12/12/2019 1037   PROTEINUR NEGATIVE 12/12/2019 1037   UROBILINOGEN 0.2 10/27/2019 1639   NITRITE NEGATIVE 12/12/2019 1037   LEUKOCYTESUR NEGATIVE 12/12/2019 1037   LEUKOCYTESUR 3+ 10/06/2012 1107   Sepsis Labs Recent Labs  Lab 10/01/23 1811 10/02/23 0620 10/03/23 0830  WBC 13.8* 12.7* 13.7*   Microbiology Recent Results (from the past 240 hours)  Resp panel by RT-PCR (RSV, Flu A&B, Covid) Anterior Nasal Swab     Status: None   Collection Time: 10/01/23  8:35 PM   Specimen: Anterior Nasal Swab  Result Value Ref Range Status   SARS Coronavirus 2 by RT PCR NEGATIVE  NEGATIVE Final    Comment: (NOTE) SARS-CoV-2 target nucleic acids are NOT DETECTED.  The SARS-CoV-2 RNA is generally detectable in upper respiratory specimens during the acute phase of infection. The lowest concentration of SARS-CoV-2 viral copies this assay  can detect is 138 copies/mL. A negative result does not preclude SARS-Cov-2 infection and should not be used as the sole basis for treatment or other patient management decisions. A negative result may occur with  improper specimen collection/handling, submission of specimen other than nasopharyngeal swab, presence of viral mutation(s) within the areas targeted by this assay, and inadequate number of viral copies(<138 copies/mL). A negative result must be combined with clinical observations, patient history, and epidemiological information. The expected result is Negative.  Fact Sheet for Patients:  BloggerCourse.com  Fact Sheet for Healthcare Providers:  SeriousBroker.it  This test is no t yet approved or cleared by the [place] FDA and  has been authorized for detection and/or diagnosis of SARS-CoV-2 by FDA under an Emergency Use Authorization (EUA). This EUA will remain  in effect (meaning this test can be used) for the duration of the COVID-19 declaration under Section 564(b)(1) of the Act, 21 U.S.C.section 360bbb-3(b)(1), unless the authorization is terminated  or revoked sooner.       Influenza A by PCR NEGATIVE NEGATIVE Final   Influenza B by PCR NEGATIVE NEGATIVE Final    Comment: (NOTE) The Xpert Xpress SARS-CoV-2/FLU/RSV plus assay is intended as an aid in the diagnosis of influenza from Nasopharyngeal swab specimens and should not be used as a sole basis for treatment. Nasal washings and aspirates are unacceptable for Xpert Xpress SARS-CoV-2/FLU/RSV testing.  Fact Sheet for Patients: BloggerCourse.com  Fact Sheet for Healthcare  Providers: SeriousBroker.it  This test is not yet approved or cleared by the [place] FDA and has been authorized for detection and/or diagnosis of SARS-CoV-2 by FDA under an Emergency Use Authorization (EUA). This EUA will remain in effect (meaning this test can be used) for the duration of the COVID-19 declaration under Section 564(b)(1) of the Act, 21 U.S.C. section 360bbb-3(b)(1), unless the authorization is terminated or revoked.     Resp Syncytial Virus by PCR NEGATIVE NEGATIVE Final    Comment: (NOTE) Fact Sheet for Patients: BloggerCourse.com  Fact Sheet for Healthcare Providers: SeriousBroker.it  This test is not yet approved or cleared by the [place] FDA and has been authorized for detection and/or diagnosis of SARS-CoV-2 by FDA under an Emergency Use Authorization (EUA). This EUA will remain in effect (meaning this test can be used) for the duration of the COVID-19 declaration under Section 564(b)(1) of the Act, 21 U.S.C. section 360bbb-3(b)(1), unless the authorization is terminated or revoked.  Performed at [facility], [address] [place], [place] [address]   Culture, blood (routine x 2)     Status: None (Preliminary result)   Collection Time: 10/01/23  8:35 PM   Specimen: BLOOD  Result Value Ref Range Status   Specimen Description BLOOD BLOOD RIGHT ARM  Final   Special Requests   Final    BOTTLES DRAWN AEROBIC AND ANAEROBIC Blood Culture adequate volume   Culture   Final    NO GROWTH 3 DAYS Performed at [facility], [address], [place], [place] [address]    Report Status PENDING  Incomplete  Respiratory (~20 pathogens) panel by PCR     Status: None   Collection Time: 10/02/23  8:11 AM   Specimen: Nasopharyngeal Swab; Respiratory  Result Value Ref Range Status   Adenovirus NOT DETECTED NOT DETECTED Final   Coronavirus 229E NOT DETECTED NOT  DETECTED Final    Comment: (NOTE) The Coronavirus on the Respiratory Panel, DOES NOT test for the novel  Coronavirus (2019 nCoV)  Coronavirus HKU1 NOT DETECTED NOT DETECTED Final   Coronavirus NL63 NOT DETECTED NOT DETECTED Final   Coronavirus OC43 NOT DETECTED NOT DETECTED Final   Metapneumovirus NOT DETECTED NOT DETECTED Final   Rhinovirus / Enterovirus NOT DETECTED NOT DETECTED Final   Influenza A NOT DETECTED NOT DETECTED Final   Influenza B NOT DETECTED NOT DETECTED Final   Parainfluenza Virus 1 NOT DETECTED NOT DETECTED Final   Parainfluenza Virus 2 NOT DETECTED NOT DETECTED Final   Parainfluenza Virus 3 NOT DETECTED NOT DETECTED Final   Parainfluenza Virus 4 NOT DETECTED NOT DETECTED Final   Respiratory Syncytial Virus NOT DETECTED NOT DETECTED Final   Bordetella pertussis NOT DETECTED NOT DETECTED Final   Bordetella Parapertussis NOT DETECTED NOT DETECTED Final   Chlamydophila pneumoniae NOT DETECTED NOT DETECTED Final   Mycoplasma pneumoniae NOT DETECTED NOT DETECTED Final    Comment: Performed at [facility], [address], [place], [place] [address]  Culture, blood (Routine X 2) w Reflex to ID Panel     Status: None (Preliminary result)   Collection Time: 10/02/23  7:55 PM   Specimen: BLOOD  Result Value Ref Range Status   Specimen Description BLOOD BLOOD LEFT HAND  Final   Special Requests   Final    BOTTLES DRAWN AEROBIC AND ANAEROBIC Blood Culture adequate volume   Culture   Final    NO GROWTH 2 DAYS Performed at [facility], [address], [place], [place] [address]    Report Status PENDING  Incomplete     Time coordinating discharge: Over 30 minutes  SIGNED:   Tresa Moore, MD  [facility] 10/04/2023, 12:22 PM Pager   If 7PM-7AM, please contact night-coverage

## 2023-10-04 NOTE — Progress Notes (Signed)
OT Cancellation Note  Patient Details Name: Kimberly Berger MRN: [id number] DOB: 29-Sep-1951   Cancelled Treatment:    Reason Eval/Treat Not Completed: OT screened, no needs identified, will sign off. Order received, chart reviewed. Pt back to baseline functional independence. No skilled OT needs identified. Will sign off. Please re-consult if additional needs arise.   Kathie Dike, M.S. OTR/L  10/04/23, 11:42 AM  ascom [phone]

## 2023-10-06 LAB — CULTURE, BLOOD (ROUTINE X 2)
Culture: NO GROWTH
Special Requests: ADEQUATE

## 2023-10-07 LAB — CULTURE, BLOOD (ROUTINE X 2)
Culture: NO GROWTH
Special Requests: ADEQUATE

## 2023-11-05 ENCOUNTER — Institutional Professional Consult (permissible substitution): Payer: 59 | Admitting: Student in an Organized Health Care Education/Training Program

## 2023-11-14 ENCOUNTER — Encounter: Payer: Self-pay | Admitting: Pulmonary Disease

## 2024-02-27 ENCOUNTER — Other Ambulatory Visit: Payer: Self-pay | Admitting: Primary Care

## 2024-02-27 DIAGNOSIS — Z1231 Encounter for screening mammogram for malignant neoplasm of breast: Secondary | ICD-10-CM

## 2024-03-11 ENCOUNTER — Encounter

## 2024-03-13 NOTE — Progress Notes (Deleted)
  HPI:      Ms. Kimberly Berger is a 73 y.o. 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  Pt reports tolerating the pessary well with *** no vaginal bleeding and *** no vaginal discharge.  Symptoms of pelvic floor weakening have greatly improved. She is voiding and defecating {With/without:5700} difficulty. She currently has a Size 2 ring  pessary. Last pessary check was 07/17/23  PMHx: She  has a past medical history of COPD (chronic obstructive pulmonary disease) (HCC), Hypertension (12/12/2019), and No pertinent past medical history. Also,  has a past surgical history that includes no surgery history., family history includes Breast cancer in her maternal aunt; Hypertension in her father.,   She has a current medication list which includes the following prescription(s): albuterol , amlodipine , guaifenesin , guaifenesin -codeine, meclizine , meloxicam , mometasone -formoterol , and tiotropium. Also, has no known allergies.  ROS  Objective: There were no vitals taken for this visit. OBGyn Exam  Pessary Care Pessary removed and cleaned.  Vagina checked - without erosions - pessary replaced.  A/P:  ***Pessary was cleaned and replaced today. Instructions given for care. Concerning symptoms to observe for are counseled to patient. Follow up scheduled for 3 months.  ***Pessary holiday advised.  Will leave out and follow symptoms closely.  Replace in 4 weeks.  Rian Chafe, CMA  [facility], [facility] 03/13/2024  3:53 PM

## 2024-03-20 ENCOUNTER — Ambulatory Visit: Admitting: Obstetrics

## 2024-03-20 DIAGNOSIS — Z4689 Encounter for fitting and adjustment of other specified devices: Secondary | ICD-10-CM

## 2024-03-25 ENCOUNTER — Encounter: Payer: Self-pay | Admitting: Obstetrics

## 2024-03-30 ENCOUNTER — Ambulatory Visit

## 2024-04-23 NOTE — Progress Notes (Signed)
  HPI:      Ms. Kimberly Berger is a 73 y.o. G3P0 who presents today for her pessary follow up and examination related to her 3rd degree uterine prolapse. Pt reports tolerating the pessary well with  no vaginal bleeding and  no vaginal discharge.  Symptoms of pelvic floor weakening have greatly improved. She is voiding and defecating without difficulty. She currently has a #2 ring with support pessary. She cleans and removes at home weekly.  PMHx: She  has a past medical history of COPD (chronic obstructive pulmonary disease) (HCC), Hypertension (12/12/2019), and No pertinent past medical history. Also,  has a past surgical history that includes no surgery history., family history includes Breast cancer in her maternal aunt; Hypertension in her father.,  reports that she has been smoking cigarettes. She has a 25 pack-year smoking history. She has never used smokeless tobacco. She reports that she does not drink alcohol and does not use drugs.  She has a current medication list which includes the following prescription(s): albuterol , amlodipine , meclizine , mometasone -formoterol , tiotropium, guaifenesin , guaifenesin -codeine, and meloxicam . Also, has no known allergies.  ROS: as per HPI  Objective: BP (!) 112/54   Pulse (!) 103   Ht 5' 4 (1.626 m)   Wt 121 lb (54.9 kg)   BMI 20.77 kg/m  Physical Exam Constitutional:      Appearance: Normal appearance.  Genitourinary:     Vulva normal.     Right Labia: No rash.    Left Labia: No rash.    No labial fusion noted.  HENT:     Head: Normocephalic and atraumatic.  Eyes:     Extraocular Movements: Extraocular movements intact.  Pulmonary:     Effort: Pulmonary effort is normal.  Musculoskeletal:        General: Normal range of motion.  Neurological:     Mental Status: She is alert.  Vitals and nursing note reviewed. Exam conducted with a chaperone present.     Pessary Care Pessary removed and cleaned.  Vagina checked - without erosions -  pessary replaced.  A/P: Pessary was cleaned and replaced today.  Device is cracked in the support portion of the ring, ordering new #2 ring with support.  Instructions given for care. Continue home removal and cleanings weekly.  Concerning symptoms to observe for are counseled to patient. Follow up scheduled for 6 months.    Estil Mangle, DO [facility]

## 2024-04-24 ENCOUNTER — Encounter: Payer: Self-pay | Admitting: Obstetrics

## 2024-04-24 ENCOUNTER — Ambulatory Visit: Admitting: Obstetrics

## 2024-04-24 VITALS — BP 100/60 | HR 103 | Ht 64.0 in | Wt 121.0 lb

## 2024-04-24 DIAGNOSIS — Z4689 Encounter for fitting and adjustment of other specified devices: Secondary | ICD-10-CM | POA: Diagnosis not present

## 2024-04-24 DIAGNOSIS — N952 Postmenopausal atrophic vaginitis: Secondary | ICD-10-CM

## 2024-04-24 DIAGNOSIS — N813 Complete uterovaginal prolapse: Secondary | ICD-10-CM

## 2024-06-06 LAB — GLUCOSE, POCT (MANUAL RESULT ENTRY): POC Glucose: 156 mg/dL — AB (ref 70–99)

## 2024-06-08 NOTE — Congregational Nurse Program (Signed)
  Dept: [phone]   Congregational Nurse Program Note  Date of Encounter: 06/06/2024  Patient encouraged to see PCP/provider regarding elevated blood glucose with fasting (last ate at 7 pm on 06/05/24), patient said her next appointment is 06/12/24 and that she would mention this to her provider.  Patient given handout for eat this not that and know your numbers handout.  Past Medical History: Past Medical History:  Diagnosis Date   COPD (chronic obstructive pulmonary disease) (HCC)    Hypertension 12/12/2019   No pertinent past medical history     Encounter Details:  Community Questionnaire - 06/06/24 1122       Questionnaire   Ask client: Do you give verbal consent for me to treat you today? Yes    Student Assistance N/A    Location Patient Served  [facility]    Encounter Setting Other   [place] [facility] event   Population Status Unknown    Insurance Medicare    Insurance/Financial Assistance Referral N/A    Medication N/A    Medical Provider Yes   Carlin Blamer   Screening Referrals Made N/A    Medical Referrals Made N/A    Medical Appointment Completed N/A    CNP Interventions Advocate/Support    Screenings CN Performed Blood Pressure;Blood Glucose    ED Visit Averted N/A    Life-Saving Intervention Made N/A          Today's Vitals   06/06/24 1115  BP: 128/64  Pulse: (!) 105  SpO2: 95%   There is no height or weight on file to calculate BMI.

## 2024-07-13 ENCOUNTER — Ambulatory Visit: Admitting: Obstetrics & Gynecology

## 2024-08-26 ENCOUNTER — Other Ambulatory Visit: Payer: Self-pay | Admitting: Primary Care

## 2024-08-26 DIAGNOSIS — R1032 Left lower quadrant pain: Secondary | ICD-10-CM

## 2024-08-27 ENCOUNTER — Encounter

## 2024-09-07 ENCOUNTER — Ambulatory Visit: Admission: RE | Admit: 2024-09-07

## 2024-10-19 ENCOUNTER — Ambulatory Visit: Admission: RE | Admit: 2024-10-19 | Source: Ambulatory Visit

## 2024-10-19 DIAGNOSIS — R1011 Right upper quadrant pain: Secondary | ICD-10-CM

## 2024-10-19 DIAGNOSIS — R10A1 Flank pain, right side: Secondary | ICD-10-CM

## 2024-11-06 ENCOUNTER — Ambulatory Visit
Admission: RE | Admit: 2024-11-06 | Discharge: 2024-11-06 | Disposition: A | Source: Ambulatory Visit | Attending: Primary Care

## 2024-11-06 ENCOUNTER — Encounter

## 2024-11-06 ENCOUNTER — Ambulatory Visit

## 2024-11-06 DIAGNOSIS — Z1231 Encounter for screening mammogram for malignant neoplasm of breast: Secondary | ICD-10-CM | POA: Insufficient documentation

## 2024-11-13 ENCOUNTER — Ambulatory Visit: Admission: RE | Admit: 2024-11-13 | Source: Ambulatory Visit
